# Patient Record
Sex: Female | Born: 1990 | Race: White | Hispanic: No | Marital: Married | Smoking: Former smoker
Health system: Southern US, Community
[De-identification: ages and names within clinical notes are randomized; demographics above are authoritative.]

## PROBLEM LIST (undated history)

## (undated) DIAGNOSIS — Z8719 Personal history of other diseases of the digestive system: Secondary | ICD-10-CM

## (undated) DIAGNOSIS — Z915 Personal history of self-harm: Secondary | ICD-10-CM

## (undated) DIAGNOSIS — Z91410 Personal history of adult physical and sexual abuse: Secondary | ICD-10-CM

## (undated) DIAGNOSIS — Z62810 Personal history of physical and sexual abuse in childhood: Secondary | ICD-10-CM

## (undated) DIAGNOSIS — G43909 Migraine, unspecified, not intractable, without status migrainosus: Secondary | ICD-10-CM

## (undated) DIAGNOSIS — IMO0002 Reserved for concepts with insufficient information to code with codable children: Secondary | ICD-10-CM

## (undated) DIAGNOSIS — Z87440 Personal history of urinary (tract) infections: Secondary | ICD-10-CM

## (undated) DIAGNOSIS — F319 Bipolar disorder, unspecified: Secondary | ICD-10-CM

## (undated) HISTORY — DX: Personal history of urinary (tract) infections: Z87.440

## (undated) HISTORY — DX: Personal history of physical and sexual abuse in childhood: Z62.810

## (undated) HISTORY — DX: Personal history of self-harm: Z91.5

## (undated) HISTORY — PX: TONSILLECTOMY: SUR1361

## (undated) HISTORY — DX: Personal history of other diseases of the digestive system: Z87.19

## (undated) HISTORY — PX: TYMPANOSTOMY TUBE PLACEMENT: SHX32

## (undated) HISTORY — DX: Morbid (severe) obesity due to excess calories: E66.01

## (undated) HISTORY — DX: Reserved for concepts with insufficient information to code with codable children: IMO0002

## (undated) HISTORY — DX: Migraine, unspecified, not intractable, without status migrainosus: G43.909

## (undated) HISTORY — DX: Personal history of adult physical and sexual abuse: Z91.410

---
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
## 2011-01-12 ENCOUNTER — Inpatient Hospital Stay: Payer: Self-pay | Admitting: Psychiatry

## 2011-01-26 ENCOUNTER — Other Ambulatory Visit: Payer: Self-pay | Admitting: Psychiatry

## 2011-04-05 LAB — GAMMA GT
GGT: 22
GGT: 23

## 2011-04-05 LAB — DRUGS OF ABUSE SCREEN W/O ALC, ROUTINE URINE
Amphetamine Screen, Ur: NEGATIVE
Barbiturate Quant, Ur: NEGATIVE
Cocaine Metabolites: NEGATIVE
Methadone: NEGATIVE
Phencyclidine (PCP): NEGATIVE

## 2011-04-05 LAB — URINALYSIS, ROUTINE W REFLEX MICROSCOPIC
Glucose, UA: NEGATIVE
Ketones, ur: NEGATIVE
Nitrite: NEGATIVE
Specific Gravity, Urine: 1.027
pH: 7.5

## 2011-04-05 LAB — DIFFERENTIAL
Basophils Absolute: 0.1
Basophils Relative: 1
Eosinophils Absolute: 0.1 — ABNORMAL LOW
Monocytes Relative: 7
Neutro Abs: 5.8
Neutrophils Relative %: 56

## 2011-04-05 LAB — BASIC METABOLIC PANEL
BUN: 9
CO2: 26
Calcium: 9.4
Chloride: 109
Creatinine, Ser: 0.64
Glucose, Bld: 100 — ABNORMAL HIGH

## 2011-04-05 LAB — CBC
MCHC: 34.3
MCV: 78
Platelets: 445 — ABNORMAL HIGH
RDW: 15.3

## 2011-04-05 LAB — GLUCOSE, RANDOM: Glucose, Bld: 89

## 2011-04-05 LAB — HEPATIC FUNCTION PANEL
Albumin: 3.3 — ABNORMAL LOW
Alkaline Phosphatase: 94
Alkaline Phosphatase: 95
Bilirubin, Direct: 0.2
Indirect Bilirubin: 0.8
Total Bilirubin: 0.8
Total Protein: 6.5

## 2011-04-05 LAB — PREGNANCY, URINE: Preg Test, Ur: NEGATIVE

## 2011-04-05 LAB — T4, FREE: Free T4: 1.26

## 2011-04-05 LAB — URINE MICROSCOPIC-ADD ON

## 2011-05-11 ENCOUNTER — Encounter: Payer: Self-pay | Admitting: Emergency Medicine

## 2011-05-11 ENCOUNTER — Emergency Department (HOSPITAL_COMMUNITY)
Admission: EM | Admit: 2011-05-11 | Discharge: 2011-05-12 | Disposition: A | Discharge status: Home / Self Care | Payer: Medicaid Other | Attending: Emergency Medicine | Admitting: Emergency Medicine

## 2011-05-11 DIAGNOSIS — R110 Nausea: Secondary | ICD-10-CM | POA: Insufficient documentation

## 2011-05-11 DIAGNOSIS — F17200 Nicotine dependence, unspecified, uncomplicated: Secondary | ICD-10-CM | POA: Insufficient documentation

## 2011-05-11 DIAGNOSIS — R1011 Right upper quadrant pain: Secondary | ICD-10-CM | POA: Insufficient documentation

## 2011-05-11 DIAGNOSIS — K219 Gastro-esophageal reflux disease without esophagitis: Secondary | ICD-10-CM | POA: Insufficient documentation

## 2011-05-11 DIAGNOSIS — J45909 Unspecified asthma, uncomplicated: Secondary | ICD-10-CM | POA: Insufficient documentation

## 2011-05-11 NOTE — ED Notes (Signed)
Patient with abdominal pain, has been going on for a couple of months.  Patient states she vomits after she eats.

## 2011-05-12 ENCOUNTER — Encounter (HOSPITAL_COMMUNITY): Payer: Self-pay | Admitting: Emergency Medicine

## 2011-05-12 ENCOUNTER — Emergency Department (HOSPITAL_COMMUNITY): Payer: Medicaid Other

## 2011-05-12 LAB — DIFFERENTIAL
Basophils Relative: 0 % (ref 0–1)
Eosinophils Absolute: 0.1 10*3/uL (ref 0.0–0.7)
Lymphs Abs: 4.6 10*3/uL — ABNORMAL HIGH (ref 0.7–4.0)
Neutrophils Relative %: 66 % (ref 43–77)

## 2011-05-12 LAB — CBC
MCH: 27 pg (ref 26.0–34.0)
MCHC: 32.4 g/dL (ref 30.0–36.0)
Platelets: 457 10*3/uL — ABNORMAL HIGH (ref 150–400)
RBC: 4.85 MIL/uL (ref 3.87–5.11)

## 2011-05-12 LAB — GC/CHLAMYDIA PROBE AMP, GENITAL
Chlamydia, DNA Probe: NEGATIVE
GC Probe Amp, Genital: NEGATIVE

## 2011-05-12 LAB — COMPREHENSIVE METABOLIC PANEL
ALT: 18 U/L (ref 0–35)
AST: 15 U/L (ref 0–37)
Albumin: 3.6 g/dL (ref 3.5–5.2)
Alkaline Phosphatase: 100 U/L (ref 39–117)
Potassium: 3.9 mEq/L (ref 3.5–5.1)
Sodium: 139 mEq/L (ref 135–145)
Total Protein: 7.2 g/dL (ref 6.0–8.3)

## 2011-05-12 LAB — URINALYSIS, ROUTINE W REFLEX MICROSCOPIC
Bilirubin Urine: NEGATIVE
Glucose, UA: NEGATIVE mg/dL
Hgb urine dipstick: NEGATIVE
Nitrite: NEGATIVE
Specific Gravity, Urine: 1.029 (ref 1.005–1.030)
pH: 6.5 (ref 5.0–8.0)

## 2011-05-12 LAB — WET PREP, GENITAL
Trich, Wet Prep: NONE SEEN
Yeast Wet Prep HPF POC: NONE SEEN

## 2011-05-12 MED ORDER — ONDANSETRON HCL 4 MG PO TABS: 4.0000 mg | ORAL_TABLET | Freq: Four times a day (QID) | ORAL | Status: AC

## 2011-05-12 MED ORDER — PROMETHAZINE HCL 25 MG RE SUPP: 25.0000 mg | Freq: Four times a day (QID) | RECTAL | Status: DC | PRN

## 2011-05-12 MED ORDER — PROMETHAZINE HCL 25 MG PO TABS: 25.0000 mg | ORAL_TABLET | Freq: Four times a day (QID) | ORAL | Status: DC | PRN

## 2011-05-12 MED ORDER — OXYCODONE-ACETAMINOPHEN 5-325 MG PO TABS
2.0000 | ORAL_TABLET | Freq: Once | ORAL | Status: AC
  Administered 2011-05-12: 2 via ORAL
  Filled 2011-05-12: qty 2

## 2011-05-12 MED ORDER — OXYCODONE-ACETAMINOPHEN 5-325 MG PO TABS: 2.0000 | ORAL_TABLET | ORAL | Status: AC | PRN

## 2011-05-12 NOTE — ED Notes (Signed)
Patient with abdominal pain, no nausea or vomiting, no diarrhea.  Patient points to lower left and right quadrants for pain.

## 2011-05-12 NOTE — ED Provider Notes (Signed)
History     CSN: [id number] Arrival date & time: 05/11/2011  8:33 PM   First MD Initiated Contact with Patient 05/12/11 0010      Chief Complaint  Patient presents with  . Abdominal Pain    (Consider location/radiation/quality/duration/timing/severity/associated sxs/prior treatment) HPI 20 year old female presents to the emergency department with complaint of abdominal pain over the last 2-4 months. Patient reports onset of left lower quadrant abdominal pain about 4 months ago. She reports she was seen at [facility] regional at that time and had labs and ultrasound done and was told to followup with either GYN or primary care Dr. Patient has not done so. Patient reports left lower quadrant pain is intermittent worse around her period and is crampy in nature. Patient reports 2 months of right upper quadrant pain after eating. Patient reports after eating she has sharp pain in her right upper quadrant and then immediately has to have a bowel movement. She denies any vomiting. She does report she has nausea after eating. Patient with history of GERD and takes Prilosec for this. Patient is a smoker. She denies taking any oral contraceptives. Last measured. 15 October. No prior history of abdominal surgeries. Patient presents today due to urging from her mother. She denies any worsening of her symptoms. She denies any weight loss  Past Medical History  Diagnosis Date  . Asthma     Past Surgical History  Procedure Date  . Tonsillectomy   . Tympanostomy tube placement     No family history on file.  History  Substance Use Topics  . Smoking status: Current Everyday Smoker    Types: Cigarettes  . Smokeless tobacco: Not on file  . Alcohol Use: Yes     occasionally    OB History    Grav Para Term Preterm Abortions TAB SAB Ect Mult Living                  Review of Systems  Constitutional: Negative.   HENT: Negative.   Eyes: Negative.   Respiratory: Negative.   Cardiovascular:  Negative.   Gastrointestinal: Positive for nausea and abdominal pain.  Genitourinary: Negative.   Musculoskeletal: Negative.   Skin: Negative.   Neurological: Negative.   Hematological: Negative.   Psychiatric/Behavioral: Negative.   All other systems reviewed and are negative.    Allergies  Other  Home Medications  No current outpatient prescriptions on file.  BP 131/93  Pulse 95  Temp(Src) 98.1 F (36.7 C) (Oral)  Resp 18  SpO2 98%  LMP 04/12/2011  Physical Exam  Constitutional: She appears well-developed and well-nourished.       Patient is morbidly obese  HENT:  Head: Normocephalic and atraumatic.  Right Ear: External ear normal.  Left Ear: External ear normal.  Nose: Nose normal.  Mouth/Throat: Oropharynx is clear and moist.  Eyes: Conjunctivae and EOM are normal. Pupils are equal, round, and reactive to light.  Neck: Normal range of motion. Neck supple. No JVD present. No tracheal deviation present. No thyromegaly present.  Cardiovascular: Normal rate, regular rhythm, normal heart sounds and intact distal pulses.  Exam reveals no gallop and no friction rub.   No murmur heard. Pulmonary/Chest: Effort normal and breath sounds normal. No stridor. No respiratory distress. She has no wheezes. She has no rales. She exhibits no tenderness.  Abdominal: Soft. Bowel sounds are normal. She exhibits no distension and no mass. There is tenderness. There is no rebound and no guarding.       Pain  in right upper quadrant with palpation. No pain with palpation of left lower quadrant  Genitourinary: Vagina normal and uterus normal. No vaginal discharge found.       Unable to reproduce left lower quadrant pain with palpation of left adnexa area. Exam limited due to body habitus  Musculoskeletal: Normal range of motion. She exhibits no edema and no tenderness.  Lymphadenopathy:    She has no cervical adenopathy.  Skin: Skin is dry. No rash noted. No erythema. No pallor.    Psychiatric: She has a normal mood and affect. Her behavior is normal. Judgment and thought content normal.    ED Course  Procedures (including critical care time)  Labs Reviewed  CBC - Abnormal; Notable for the following:    WBC 16.8 (*)    Platelets 457 (*)    All other components within normal limits  DIFFERENTIAL - Abnormal; Notable for the following:    Neutro Abs 11.0 (*)    Lymphs Abs 4.6 (*)    All other components within normal limits  WET PREP, GENITAL - Abnormal; Notable for the following:    Clue Cells, Wet Prep FEW (*)    WBC, Wet Prep HPF POC FEW (*)    All other components within normal limits  COMPREHENSIVE METABOLIC PANEL  LIPASE, BLOOD  URINALYSIS, ROUTINE W REFLEX MICROSCOPIC  PREGNANCY, URINE  GC/CHLAMYDIA PROBE AMP, GENITAL      MDM  20 year old female with ongoing right upper quadrant and left lower "her abdominal pain for several months. Patient with slight leukocytosis here today. Body habitus suggests polycystic ovarian disease. Will check ultrasound for possible cholelithiasis as cause of pain. Will refer patient to gastroenterology and/or primary care Dr. if ultrasound negative for further workup        Olivia Mackie, MD 05/12/11 [phone]

## 2011-05-15 ENCOUNTER — Emergency Department: Payer: Self-pay | Admitting: Emergency Medicine

## 2011-06-09 ENCOUNTER — Inpatient Hospital Stay: Payer: Self-pay | Admitting: Psychiatry

## 2011-06-23 ENCOUNTER — Emergency Department: Payer: Self-pay | Admitting: Emergency Medicine

## 2011-08-01 ENCOUNTER — Emergency Department: Payer: Self-pay | Admitting: Emergency Medicine

## 2011-08-01 LAB — URINALYSIS, COMPLETE
Bacteria: NONE SEEN
Bilirubin,UR: NEGATIVE
Blood: NEGATIVE
Glucose,UR: NEGATIVE mg/dL (ref 0–75)
Hyaline Cast: 1
Ketone: NEGATIVE
Specific Gravity: 1.019 (ref 1.003–1.030)
Squamous Epithelial: 6
WBC UR: 1 /HPF (ref 0–5)

## 2011-08-01 LAB — PREGNANCY, URINE: Pregnancy Test, Urine: NEGATIVE m[IU]/mL

## 2011-08-22 ENCOUNTER — Emergency Department: Payer: Self-pay | Admitting: *Deleted

## 2011-08-22 LAB — COMPREHENSIVE METABOLIC PANEL
Alkaline Phosphatase: 84 U/L (ref 82–169)
BUN: 9 mg/dL (ref 7–18)
Chloride: 107 mmol/L (ref 98–107)
EGFR (African American): >60
EGFR (Non-African Amer.): >60
Glucose: 92 mg/dL (ref 65–99)
SGOT(AST): 17 U/L (ref 0–26)
SGPT (ALT): 31 U/L
Total Protein: 7.1 g/dL (ref 6.4–8.6)

## 2011-08-22 LAB — URINALYSIS, COMPLETE
Bilirubin,UR: NEGATIVE
Glucose,UR: NEGATIVE mg/dL (ref 0–75)
Ketone: NEGATIVE
RBC,UR: 2 /HPF (ref 0–5)
Specific Gravity: 1.023 (ref 1.003–1.030)
Squamous Epithelial: 10

## 2011-08-22 LAB — CBC
HCT: 39.7 % (ref 35.0–47.0)
MCHC: 33.9 g/dL (ref 32.0–36.0)
Platelet: 423 10*3/uL (ref 150–440)
RDW: 15.3 % — ABNORMAL HIGH (ref 11.5–14.5)
WBC: 10.9 10*3/uL (ref 3.6–11.0)

## 2011-08-22 LAB — PREGNANCY, URINE: Pregnancy Test, Urine: NEGATIVE m[IU]/mL

## 2011-08-23 ENCOUNTER — Emergency Department (HOSPITAL_COMMUNITY)
Admission: EM | Admit: 2011-08-23 | Discharge: 2011-08-23 | Disposition: A | Discharge status: Home Health | Payer: Medicaid Other | Attending: Emergency Medicine | Admitting: Emergency Medicine

## 2011-08-23 ENCOUNTER — Encounter (HOSPITAL_COMMUNITY): Payer: Self-pay | Admitting: *Deleted

## 2011-08-23 ENCOUNTER — Emergency Department (HOSPITAL_COMMUNITY): Payer: Medicaid Other

## 2011-08-23 DIAGNOSIS — J45909 Unspecified asthma, uncomplicated: Secondary | ICD-10-CM | POA: Insufficient documentation

## 2011-08-23 DIAGNOSIS — R1110 Vomiting, unspecified: Secondary | ICD-10-CM | POA: Insufficient documentation

## 2011-08-23 DIAGNOSIS — M545 Low back pain, unspecified: Secondary | ICD-10-CM | POA: Insufficient documentation

## 2011-08-23 DIAGNOSIS — R1031 Right lower quadrant pain: Secondary | ICD-10-CM | POA: Insufficient documentation

## 2011-08-23 DIAGNOSIS — F319 Bipolar disorder, unspecified: Secondary | ICD-10-CM | POA: Insufficient documentation

## 2011-08-23 DIAGNOSIS — R197 Diarrhea, unspecified: Secondary | ICD-10-CM | POA: Insufficient documentation

## 2011-08-23 DIAGNOSIS — N946 Dysmenorrhea, unspecified: Secondary | ICD-10-CM | POA: Insufficient documentation

## 2011-08-23 HISTORY — DX: Bipolar disorder, unspecified: F31.9

## 2011-08-23 LAB — PREGNANCY, URINE: Preg Test, Ur: NEGATIVE

## 2011-08-23 LAB — COMPREHENSIVE METABOLIC PANEL
BUN: 8 mg/dL (ref 6–23)
Calcium: 9.5 mg/dL (ref 8.4–10.5)
Creatinine, Ser: 0.62 mg/dL (ref 0.50–1.10)
GFR calc Af Amer: >90 mL/min (ref >90)
GFR calc non Af Amer: >90 mL/min (ref >90)
Glucose, Bld: 97 mg/dL (ref 70–99)
Sodium: 139 mEq/L (ref 135–145)
Total Protein: 6.9 g/dL (ref 6.0–8.3)

## 2011-08-23 LAB — CBC
HCT: 37.9 % (ref 36.0–46.0)
MCH: 27.1 pg (ref 26.0–34.0)
MCHC: 33.8 g/dL (ref 30.0–36.0)
MCV: 80.3 fL (ref 78.0–100.0)
RDW: 14.5 % (ref 11.5–15.5)

## 2011-08-23 LAB — URINE MICROSCOPIC-ADD ON

## 2011-08-23 LAB — URINALYSIS, ROUTINE W REFLEX MICROSCOPIC
Ketones, ur: NEGATIVE mg/dL
Protein, ur: 30 mg/dL — AB
Urobilinogen, UA: 0.2 mg/dL (ref 0.0–1.0)

## 2011-08-23 LAB — WET PREP, GENITAL
Trich, Wet Prep: NONE SEEN
Yeast Wet Prep HPF POC: NONE SEEN

## 2011-08-23 MED ORDER — ACETAMINOPHEN 325 MG PO TABS
975.0000 mg | ORAL_TABLET | Freq: Once | ORAL | Status: AC
  Administered 2011-08-23: 975 mg via ORAL
  Filled 2011-08-23: qty 3

## 2011-08-23 NOTE — Discharge Instructions (Signed)
I am sorry you are having pain.  I think this is related to your menstrual cycle.  I have provided you with two options for a Gynecologist here in [place].  Please try to follow up with a Gynecologist about your pain.   Dysmenorrhea Dysmenorrhea is pain during a menstrual period. The pain is caused by the tightening (contracting) of the muscles of the uterus. Headache, feeling sick to your stomach (nausea), throwing up (vomiting), or low back pain may occur with this condition. HOME CARE  Only take medicine as told by your doctor.   Place a heating pad or hot water bottle on your lower back or belly (abdomen). Do not sleep with a heating pad.   Exercise may help lessen the pain.   Massage the lower back or belly.   Stop smoking.   Avoid alcohol or caffeine.   Try yoga or acupuncture.  GET HELP RIGHT AWAY IF:   Your pain does not get better with medicine.   Your pain gets worse while taking pain medicine.   You have a fever.   You keep feeling sick to your stomach or keep throwing up.   Your pain moves to your upper belly.   Your period bleeding is heavier than normal.   You pass out (faint).  MAKE SURE YOU:  Understand these instructions.   Will watch your condition.   Will get help right away if you are not doing well or get worse.  Document Released: 09/10/2008 Document Revised: 02/24/2011 Document Reviewed: 10/20/2009 [facility] Patient Information 2012 [place], [place].

## 2011-08-23 NOTE — ED Notes (Signed)
Pt states that she is having lower abdominal pain more right sided than left, period started on the way to hospital. Pt states that the pain has been going on for a week. Pt states pain radiates to her back.

## 2011-08-23 NOTE — ED Notes (Signed)
Patient refusing heat application to affected area

## 2011-08-23 NOTE — ED Notes (Signed)
Patient presents with right groin pain that has been present for several days with pain now radiating to right flank. Patient denies urinary symptoms of frequency, urgency and dysuria, patient denies vaginal discharge other than her menstrual cycle that began this morning.  Patient alert and oriented x 4, patient reporting pain 7/10

## 2011-08-23 NOTE — ED Provider Notes (Signed)
I saw and evaluated the patient, reviewed the resident's note and I agree with the findings and plan.  Patient personally seen by me as well as the resident, recurrent and similar pain to what she was seen for in November workup here today negative for pregnancy negative pelvic ultrasound. No evidence of ovarian cyst exam not consistent with PID. No acute findings. Pelvic cultures pending but no discharge. Will refer to GYN locally the patient lives in the [place] area she may want to seek GYN followup there.  Shelda Jakes, MD 08/23/11 [phone]

## 2011-08-23 NOTE — ED Provider Notes (Signed)
History     CSN: [id number]  Arrival date & time 08/23/11  4098   First MD Initiated Contact with Patient 08/23/11 [phone]      Chief Complaint  Patient presents with  . Abdominal Pain    HPI Pt presents with right lower quadrant pain that has been present for one week.  She says the pain is constant and has been getting worse.  She says she has not had a fever or chills, and says she vomited once about three days ago. She says she "always has diarrhea" and it is no different.  No blood in stool.     She says her period started today.  She went to [facility] yesterday, and says they did "a piss test and a blood test and sent me on my way."   The prescribed her tramadol.   She is also having low back pain.  This started about 3 days ago.  She says it hurts to walk or move. She has not had any new injury.  She denies weakness, numbness or tingling in her feet.  Denies incontinence.  No dysuria, no blood in urine.   Past Medical History  Diagnosis Date  . Asthma   . Bipolar disorder     Pt is supposed to be taking medications and is not.     Past Surgical History  Procedure Date  . Tonsillectomy   . Tympanostomy tube placement     History reviewed. No pertinent family history.  History  Substance Use Topics  . Smoking status: Current Everyday Smoker    Types: Cigarettes  . Smokeless tobacco: Not on file  . Alcohol Use: Yes     occasionally    Review of Systems  Constitutional: Negative for fever and chills.  HENT: Negative for rhinorrhea.   Eyes: Negative for visual disturbance.  Respiratory: Negative for shortness of breath.   Cardiovascular: Negative for chest pain.  Gastrointestinal: Positive for abdominal pain.  Genitourinary: Negative for dysuria and vaginal discharge.  Musculoskeletal: Positive for back pain.  Skin: Negative for rash.    Allergies  Ibuprofen and Other  Home Medications   Current Outpatient Rx  Name Route Sig Dispense Refill  .  TRAMADOL HCL 50 MG PO TABS Oral Take 50 mg by mouth every 6 (six) hours as needed. For pain      BP 140/72  Pulse 78  Temp(Src) 97.9 F (36.6 C) (Oral)  Resp 20  SpO2 99%  Physical Exam  Constitutional:       Morbidly obese female in no distress.  HENT:  Head: Normocephalic and atraumatic.  Mouth/Throat: Oropharynx is clear and moist.  Eyes: EOM are normal. Pupils are equal, round, and reactive to light. No scleral icterus.  Neck: Normal range of motion. Neck supple.  Cardiovascular: Normal rate, regular rhythm and normal heart sounds.   Pulmonary/Chest: Effort normal and breath sounds normal. No respiratory distress.  Abdominal: Soft. There is generalized tenderness. There is no rebound and no guarding.       Obese, difficult to palpate organs due to obesity.   Genitourinary: Pelvic exam was performed with patient prone. There is no rash on the right labia. There is no rash on the left labia. Cervix exhibits no discharge. No erythema around the vagina. No vaginal discharge found.       Pt with blood in vaginal vault and cervix.  Exam severely limited by obesity, but uterus feels normal size and no adnexal masses palpated.  Musculoskeletal:       Pt has lumbar back pain with palpation, no CVA tenderness.  She has normal ROM, normal gait.  She is able to stand to undress for Pelvic exam and get into stretcher in lithotomy position without assistance.     ED Course  Procedures (including critical care time)  Labs Reviewed  URINALYSIS, ROUTINE W REFLEX MICROSCOPIC - Abnormal; Notable for the following:    Color, Urine AMBER (*) BIOCHEMICALS MAY BE AFFECTED BY COLOR   APPearance CLOUDY (*)    Hgb urine dipstick LARGE (*)    Protein, ur 30 (*)    All other components within normal limits  CBC - Abnormal; Notable for the following:    WBC 16.7 (*)    Platelets 427 (*)    All other components within normal limits  COMPREHENSIVE METABOLIC PANEL - Abnormal; Notable for the  following:    Albumin 3.4 (*)    All other components within normal limits  WET PREP, GENITAL - Abnormal; Notable for the following:    Clue Cells Wet Prep HPF POC FEW (*)    WBC, Wet Prep HPF POC FEW (*)    All other components within normal limits  URINE MICROSCOPIC-ADD ON - Abnormal; Notable for the following:    Squamous Epithelial / LPF MANY (*)    All other components within normal limits  PREGNANCY, URINE  LIPASE, BLOOD  GC/CHLAMYDIA PROBE AMP, GENITAL   US Transvaginal Non-ob  08/23/2011  *RADIOLOGY REPORT*  Clinical Data: Right pelvic pain  TRANSABDOMINAL AND TRANSVAGINAL ULTRASOUND OF PELVIS Technique:  Both transabdominal and transvaginal ultrasound examinations of the pelvis were performed. Transabdominal technique was performed for global imaging of the pelvis including uterus, ovaries, adnexal regions, and pelvic cul-de-sac.  Comparison: None.   It was necessary to proceed with endovaginal exam following the transabdominal exam to visualize the bilateral ovaries.  Findings:  Uterus: Normal in size and appearance, measuring 7.0 x 3.5 x 4.5 cm.  Endometrium: Normal in thickness and appearance, measuring 5 mm.  Right ovary:  Normal appearance/no adnexal mass, measuring 3.9 x 2.0 x 2.9 cm.  Left ovary: Normal appearance/no adnexal mass, measuring 4.4 x 2.4 x 2.7 cm.  Other findings: No free fluid.  IMPRESSION: Normal study.  No evidence of pelvic mass or other significant abnormality.  Original Report Authenticated By: Charline Bills, M.D.   US Pelvis Complete  08/23/2011  *RADIOLOGY REPORT*  Clinical Data: Right pelvic pain  TRANSABDOMINAL AND TRANSVAGINAL ULTRASOUND OF PELVIS Technique:  Both transabdominal and transvaginal ultrasound examinations of the pelvis were performed. Transabdominal technique was performed for global imaging of the pelvis including uterus, ovaries, adnexal regions, and pelvic cul-de-sac.  Comparison: None.   It was necessary to proceed with endovaginal exam  following the transabdominal exam to visualize the bilateral ovaries.  Findings:  Uterus: Normal in size and appearance, measuring 7.0 x 3.5 x 4.5 cm.  Endometrium: Normal in thickness and appearance, measuring 5 mm.  Right ovary:  Normal appearance/no adnexal mass, measuring 3.9 x 2.0 x 2.9 cm.  Left ovary: Normal appearance/no adnexal mass, measuring 4.4 x 2.4 x 2.7 cm.  Other findings: No free fluid.  IMPRESSION: Normal study.  No evidence of pelvic mass or other significant abnormality.  Original Report Authenticated By: Charline Bills, M.D.     1. Menstrual pain       MDM  Pt is allergic to ibuprofen and OCP's.  Advised using tylenol as needed for pain.  Pt given contact  information and instructed to follow up with GYN.         Ardyth Gal, MD 08/23/11 [phone]

## 2011-08-24 LAB — GC/CHLAMYDIA PROBE AMP, GENITAL
Chlamydia, DNA Probe: NEGATIVE
GC Probe Amp, Genital: NEGATIVE

## 2011-09-20 ENCOUNTER — Inpatient Hospital Stay: Payer: Self-pay | Admitting: Unknown Physician Specialty

## 2011-09-20 LAB — DRUG SCREEN, URINE
Barbiturates, Ur Screen: NEGATIVE (ref ?–200)
Benzodiazepine, Ur Scrn: NEGATIVE (ref ?–200)
Cannabinoid 50 Ng, Ur ~~LOC~~: POSITIVE (ref ?–50)
Opiate, Ur Screen: NEGATIVE (ref ?–300)

## 2011-09-20 LAB — SALICYLATE LEVEL: Salicylates, Serum: 2.9 mg/dL — ABNORMAL HIGH

## 2011-09-20 LAB — COMPREHENSIVE METABOLIC PANEL
Alkaline Phosphatase: 96 U/L (ref 82–169)
Bilirubin,Total: 0.4 mg/dL (ref 0.2–1.0)
Calcium, Total: 8.9 mg/dL — ABNORMAL LOW (ref 9.0–10.7)
Creatinine: 0.62 mg/dL (ref 0.60–1.30)
EGFR (African American): >60
EGFR (Non-African Amer.): >60
Glucose: 88 mg/dL (ref 65–99)
SGPT (ALT): 22 U/L
Sodium: 141 mmol/L (ref 136–145)
Total Protein: 6.8 g/dL (ref 6.4–8.6)

## 2011-09-20 LAB — CBC
HCT: 40.9 % (ref 35.0–47.0)
HGB: 13.6 g/dL (ref 12.0–16.0)
MCH: 26.9 pg (ref 26.0–34.0)
RBC: 5.05 10*6/uL (ref 3.80–5.20)

## 2011-09-20 LAB — TSH: Thyroid Stimulating Horm: 0.975 u[IU]/mL

## 2011-09-20 LAB — ACETAMINOPHEN LEVEL: Acetaminophen: <2 ug/mL

## 2011-09-20 LAB — ETHANOL: Ethanol %: <0.003 % (ref 0.000–0.080)

## 2011-09-21 LAB — URINALYSIS, COMPLETE
Ketone: NEGATIVE
Leukocyte Esterase: NEGATIVE
Protein: NEGATIVE
Squamous Epithelial: 4

## 2011-09-21 LAB — BEHAVIORAL MEDICINE 1 PANEL
Alkaline Phosphatase: 82 U/L (ref 82–169)
Anion Gap: 8 (ref 7–16)
Basophil #: 0.1 10*3/uL (ref 0.0–0.1)
Basophil %: 0.4 %
Bilirubin,Total: 0.5 mg/dL (ref 0.2–1.0)
Chloride: 107 mmol/L (ref 98–107)
Co2: 26 mmol/L (ref 21–32)
EGFR (African American): >60
Eosinophil #: 0.2 10*3/uL (ref 0.0–0.7)
Eosinophil %: 1.6 %
Glucose: 85 mg/dL (ref 65–99)
Lymphocyte #: 4.3 10*3/uL — ABNORMAL HIGH (ref 1.0–3.6)
Lymphocyte %: 31.5 %
MCHC: 32.4 g/dL (ref 32.0–36.0)
Monocyte #: 0.8 10*3/uL — ABNORMAL HIGH (ref 0.0–0.7)
Neutrophil #: 8.3 10*3/uL — ABNORMAL HIGH (ref 1.4–6.5)
Osmolality: 279 (ref 275–301)
Platelet: 379 10*3/uL (ref 150–440)
RBC: 4.78 10*6/uL (ref 3.80–5.20)
SGPT (ALT): 22 U/L
WBC: 13.7 10*3/uL — ABNORMAL HIGH (ref 3.6–11.0)

## 2011-10-03 ENCOUNTER — Emergency Department: Payer: Self-pay | Admitting: Emergency Medicine

## 2011-10-05 ENCOUNTER — Emergency Department: Payer: Self-pay | Admitting: Emergency Medicine

## 2011-10-14 LAB — COMPREHENSIVE METABOLIC PANEL
Alkaline Phosphatase: 62 U/L — ABNORMAL LOW (ref 82–169)
Calcium, Total: 8.2 mg/dL — ABNORMAL LOW (ref 9.0–10.7)
Chloride: 106 mmol/L (ref 98–107)
Co2: 26 mmol/L (ref 21–32)
EGFR (African American): >60
Osmolality: 281 (ref 275–301)
Potassium: 3.7 mmol/L (ref 3.5–5.1)
SGOT(AST): 14 U/L (ref 0–26)
SGPT (ALT): 23 U/L
Total Protein: 6.1 g/dL — ABNORMAL LOW (ref 6.4–8.6)

## 2011-10-14 LAB — CBC WITH DIFFERENTIAL/PLATELET
Basophil #: 0.1 10*3/uL (ref 0.0–0.1)
Basophil %: 0.5 %
Eosinophil #: 0.1 10*3/uL (ref 0.0–0.7)
HCT: 37.4 % (ref 35.0–47.0)
Lymphocyte %: 34.1 %
MCH: 26.1 pg (ref 26.0–34.0)
MCV: 80 fL (ref 80–100)
Monocyte #: 0.9 x10 3/mm (ref 0.2–0.9)
Neutrophil #: 9.2 10*3/uL — ABNORMAL HIGH (ref 1.4–6.5)
WBC: 15.6 10*3/uL — ABNORMAL HIGH (ref 3.6–11.0)

## 2011-10-14 LAB — SALICYLATE LEVEL: Salicylates, Serum: 2.7 mg/dL

## 2011-10-14 LAB — ACETAMINOPHEN LEVEL: Acetaminophen: <2 ug/mL

## 2011-10-14 LAB — CK: CK, Total: 22 U/L (ref 21–215)

## 2011-10-15 ENCOUNTER — Inpatient Hospital Stay: Payer: Self-pay | Admitting: Psychiatry

## 2011-10-15 LAB — URINALYSIS, COMPLETE
Bilirubin,UR: NEGATIVE
Blood: NEGATIVE
Glucose,UR: NEGATIVE mg/dL (ref 0–75)
Leukocyte Esterase: NEGATIVE
Ph: 5 (ref 4.5–8.0)
RBC,UR: 1 /HPF (ref 0–5)
WBC UR: 1 /HPF (ref 0–5)

## 2011-10-15 LAB — DRUG SCREEN, URINE
Benzodiazepine, Ur Scrn: POSITIVE (ref ?–200)
Cannabinoid 50 Ng, Ur ~~LOC~~: POSITIVE (ref ?–50)
MDMA (Ecstasy)Ur Screen: POSITIVE (ref ?–500)
Methadone, Ur Screen: NEGATIVE (ref ?–300)
Opiate, Ur Screen: NEGATIVE (ref ?–300)
Phencyclidine (PCP) Ur S: NEGATIVE (ref ?–25)
Tricyclic, Ur Screen: NEGATIVE (ref ?–1000)

## 2011-10-15 LAB — VALPROIC ACID LEVEL: Valproic Acid: 78 ug/mL

## 2011-10-15 LAB — ACETAMINOPHEN LEVEL: Acetaminophen: 4 ug/mL — ABNORMAL LOW

## 2011-10-16 LAB — CBC WITH DIFFERENTIAL/PLATELET
Basophil #: 0.1 10*3/uL (ref 0.0–0.1)
Eosinophil #: 0.1 10*3/uL (ref 0.0–0.7)
HGB: 12.1 g/dL (ref 12.0–16.0)
MCH: 25.3 pg — ABNORMAL LOW (ref 26.0–34.0)
MCHC: 31.7 g/dL — ABNORMAL LOW (ref 32.0–36.0)
MCV: 80 fL (ref 80–100)
Monocyte %: 3.2 %
Neutrophil #: 13.1 10*3/uL — ABNORMAL HIGH (ref 1.4–6.5)
Neutrophil %: 77.6 %
Platelet: 462 10*3/uL — ABNORMAL HIGH (ref 150–440)
WBC: 16.9 10*3/uL — ABNORMAL HIGH (ref 3.6–11.0)

## 2011-10-18 LAB — BASIC METABOLIC PANEL
Anion Gap: 9 (ref 7–16)
BUN: 10 mg/dL (ref 7–18)
Calcium, Total: 9.4 mg/dL (ref 9.0–10.7)
Chloride: 107 mmol/L (ref 98–107)
Creatinine: 0.71 mg/dL (ref 0.60–1.30)
EGFR (African American): >60
EGFR (Non-African Amer.): >60
Sodium: 141 mmol/L (ref 136–145)

## 2011-10-18 LAB — LITHIUM LEVEL: Lithium: 0.53 mmol/L — ABNORMAL LOW

## 2011-12-08 ENCOUNTER — Emergency Department: Payer: Self-pay | Admitting: *Deleted

## 2012-10-10 IMAGING — US ABDOMEN ULTRASOUND
1 series · 17 of 25 positions shown · non-contrast
Comparison: none

REASON FOR EXAM: RUQ pain
COMMENTS:

[Series 1: abdomen ultrasound · 17 of 74 slices shown]
[im 1/74]
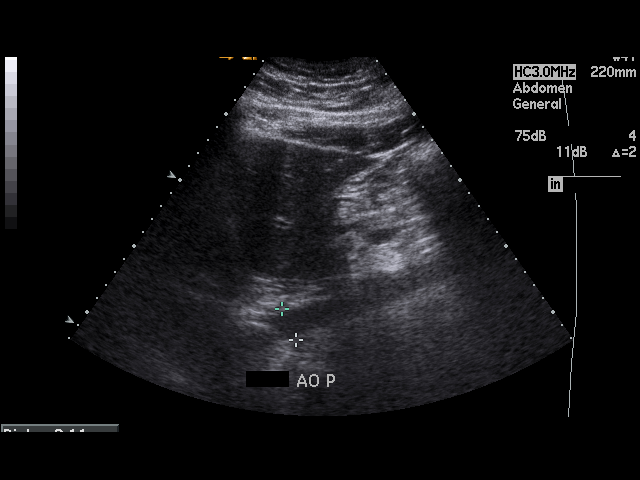
[im 7/74]
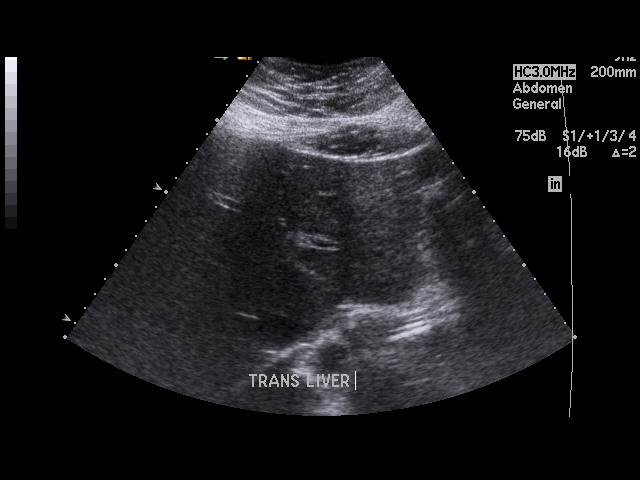
[im 10/74]
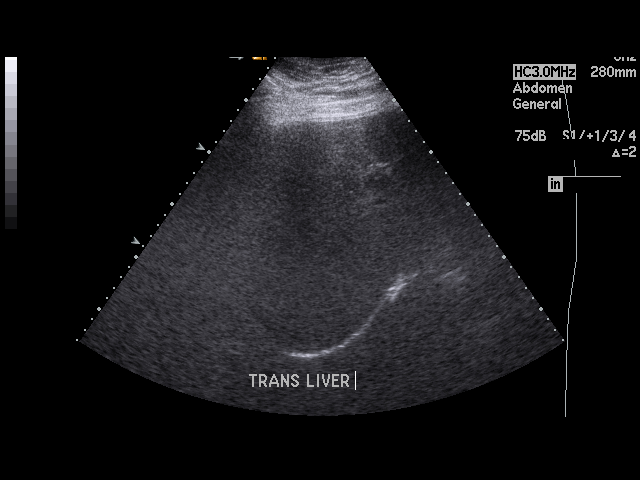
[im 16/74]
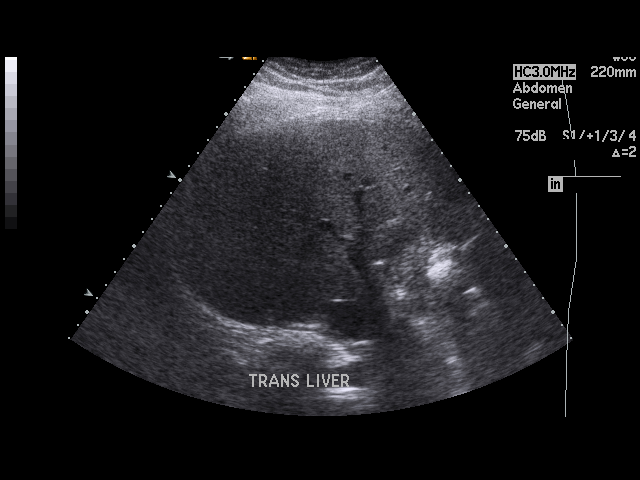
[im 19/74]
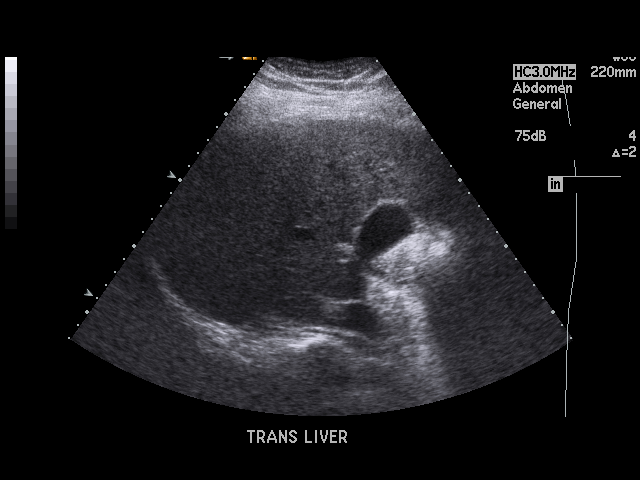
[im 25/74]
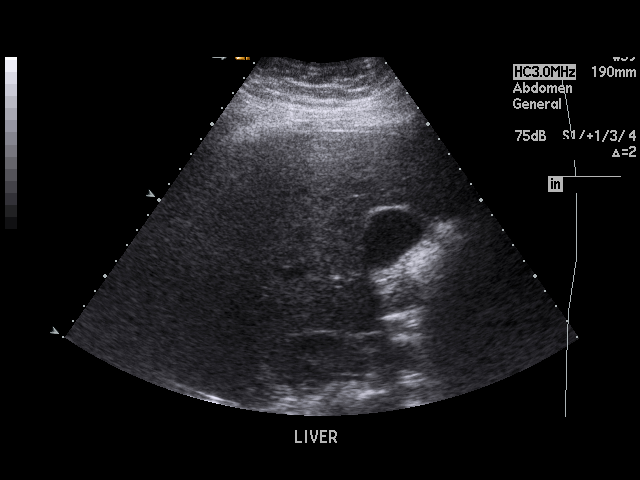
[im 28/74]
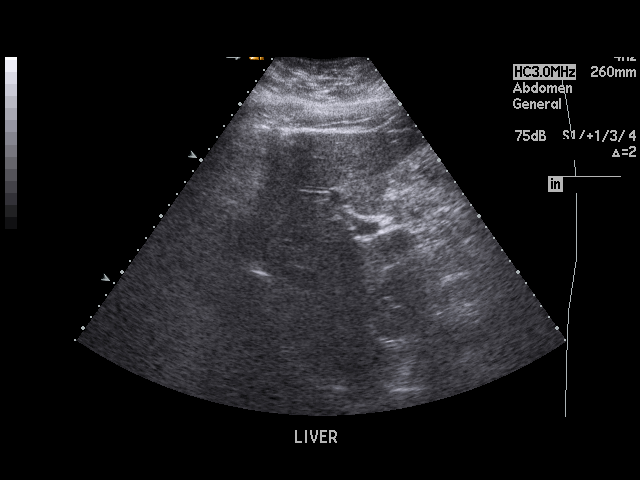
[im 34/74]
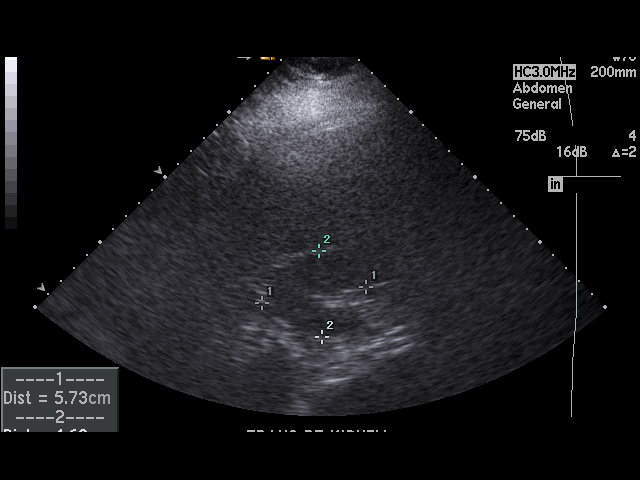
[im 37/74]
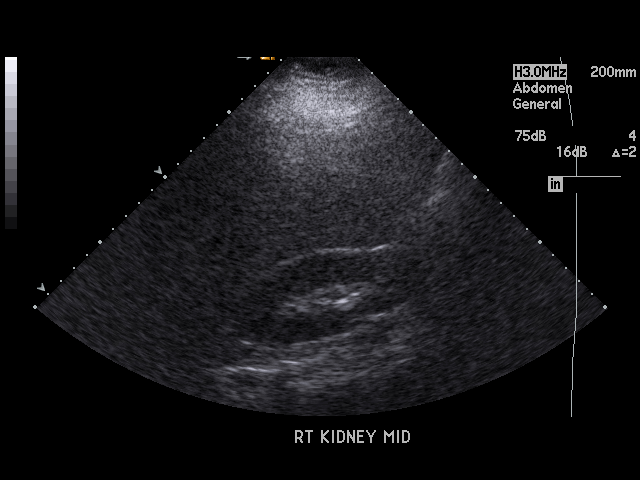
[im 40/74]
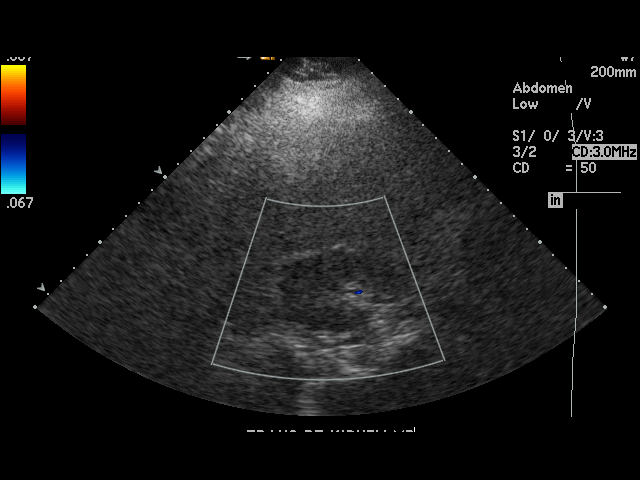
[im 46/74]
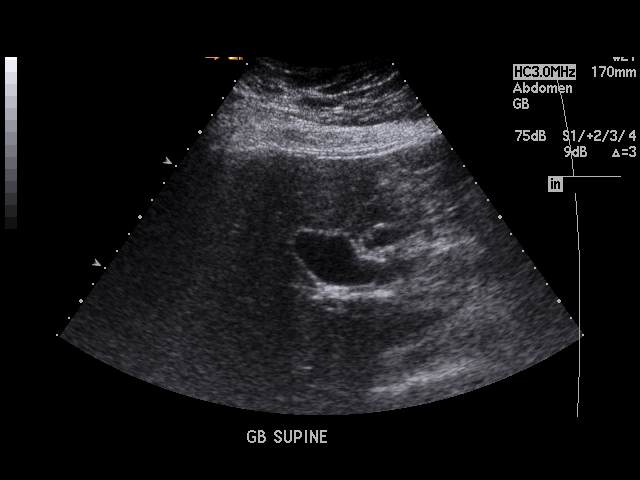
[im 49/74]
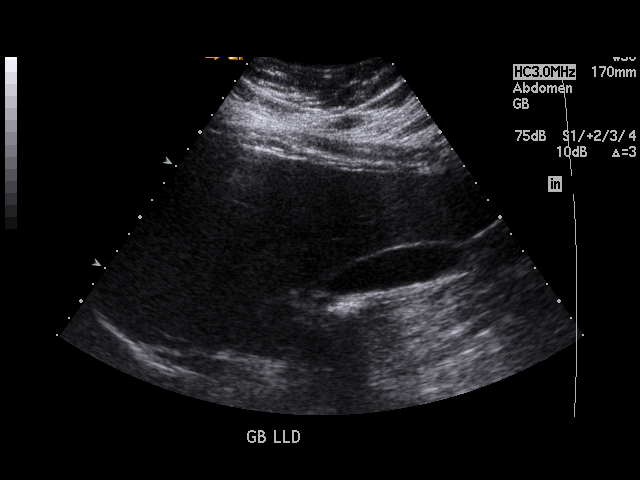
[im 55/74]
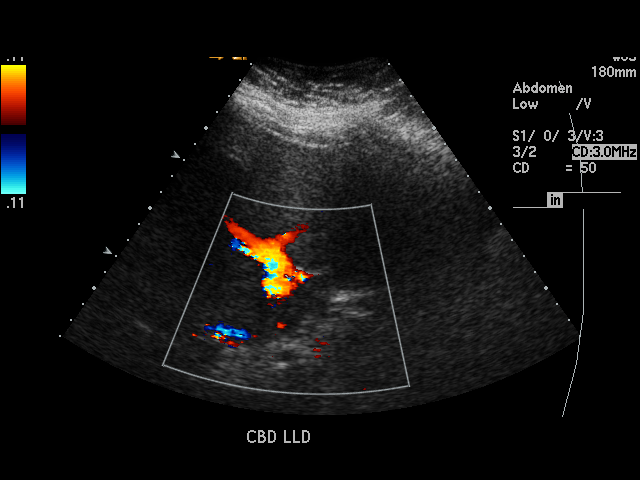
[im 58/74]
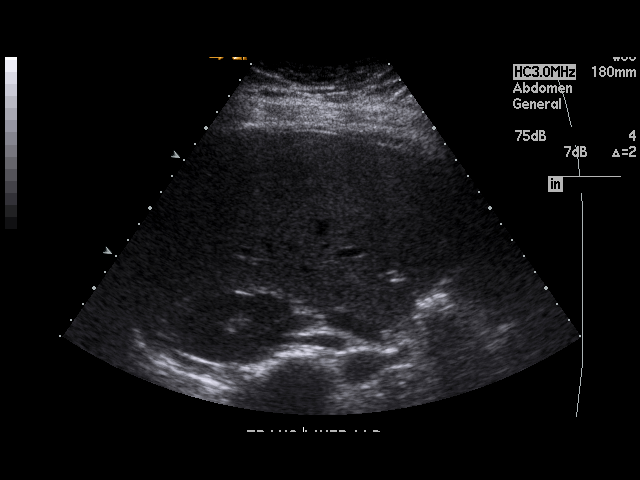
[im 64/74]
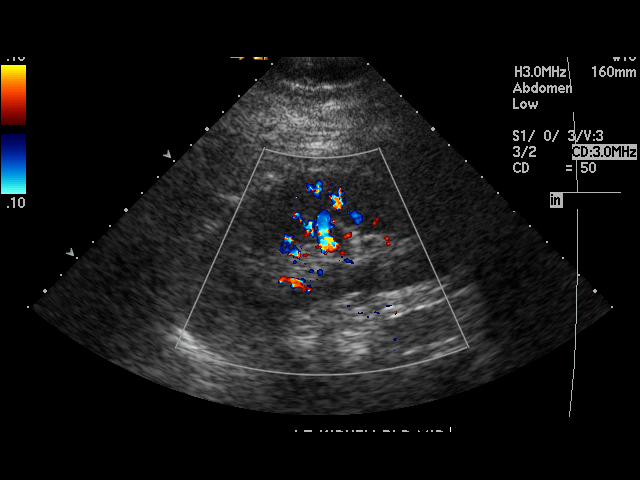
[im 67/74]
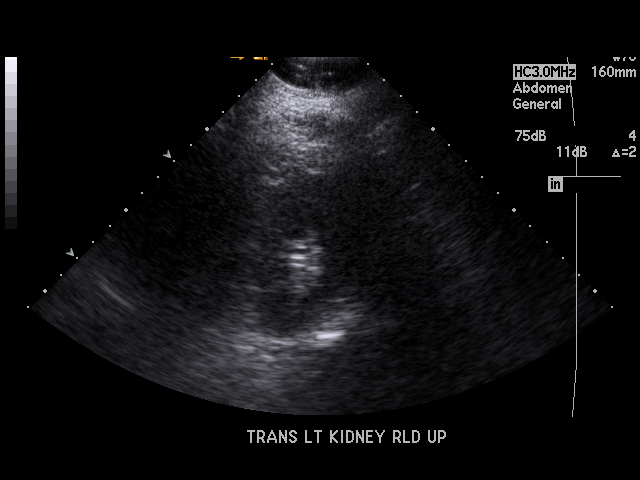
[im 74/74]
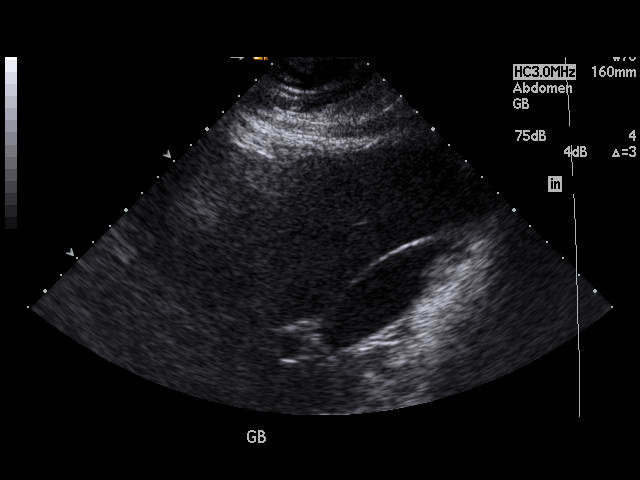

[17 of 25 positions shown; findings below may reference images not displayed]

PROCEDURE:     US  - US ABDOMEN GENERAL SURVEY  - June 12, 2011  [DATE]

RESULT:     Findings: The liver demonstrates homogeneous echotexture.
Hepatic pedal flow is identified within the portal vein. The aorta and IVC
are unremarkable. Partial visualization of the pancreas in unremarkable.
Evaluation gallbladder fossa demonstrates no evidence of pericholecystic
fluid gallstones or sludging. Gallbladder wall thickness is 2.4 mm. Common
bile that measures 4.1 mm in diameter. The spleen demonstrates a homogeneous
echotecture and measures 13.08 cm  in longitudinal dimensions. Evaluation of
the kidneys demonstrates no evidence of hydronephrosis, masses or calculi.
The right kidney measures 11.18 x 5.73 x 4.69 cm the left 12.79 x 4.87 x
5.76 cm.
IMPRESSION: Unremarkable abdominal ultrasound as described above.

## 2012-12-21 IMAGING — US US PELVIS COMPLETE
1 series · 14 of 25 positions shown · non-contrast
Comparison: None.

CLINICAL DATA: Right pelvic pain

TRANSABDOMINAL AND TRANSVAGINAL ULTRASOUND OF PELVIS
TECHNIQUE: Both transabdominal and transvaginal ultrasound
examinations of the pelvis were performed. Transabdominal technique
was performed for global imaging of the pelvis including uterus,
ovaries, adnexal regions, and pelvic cul-de-sac.

[Series 1: us pelvis complete · 0.36mm/px · 14 of 47 slices shown]
[im 1/47]
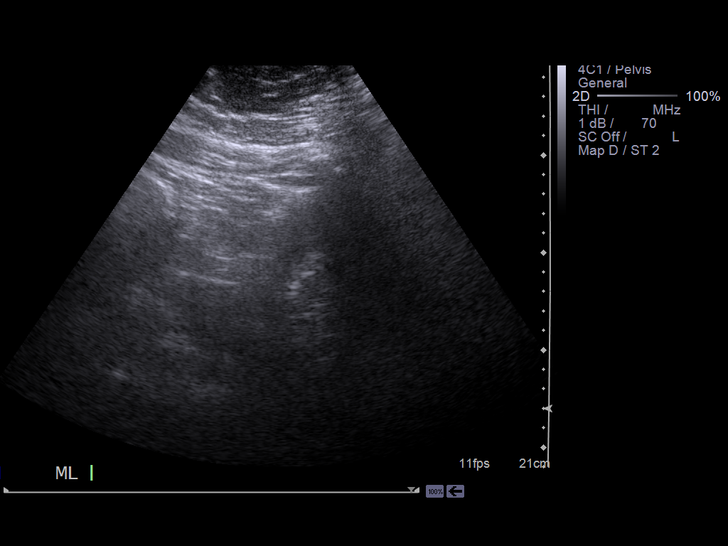
[im 4/47]
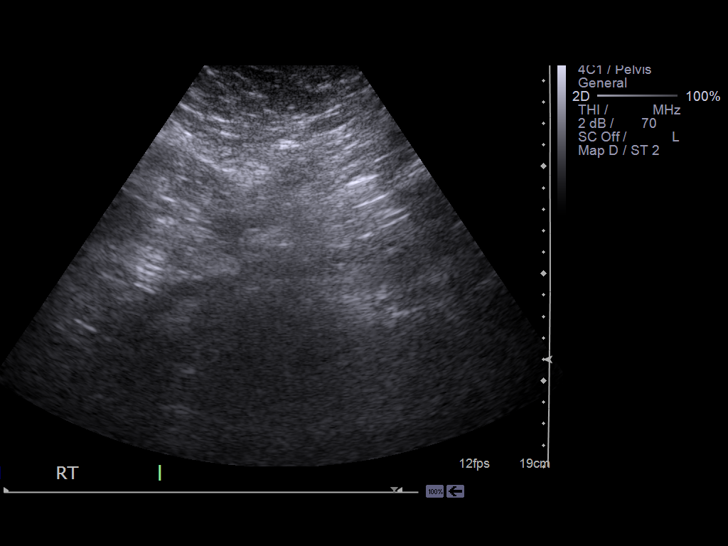
[im 8/47]
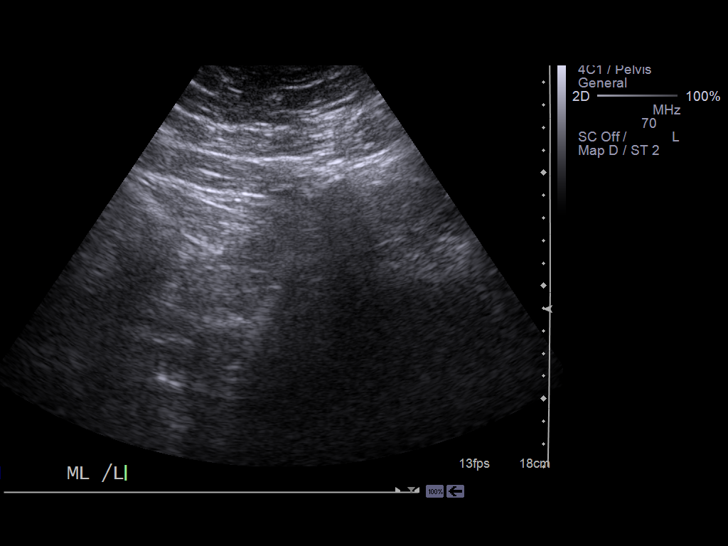
[im 12/47]
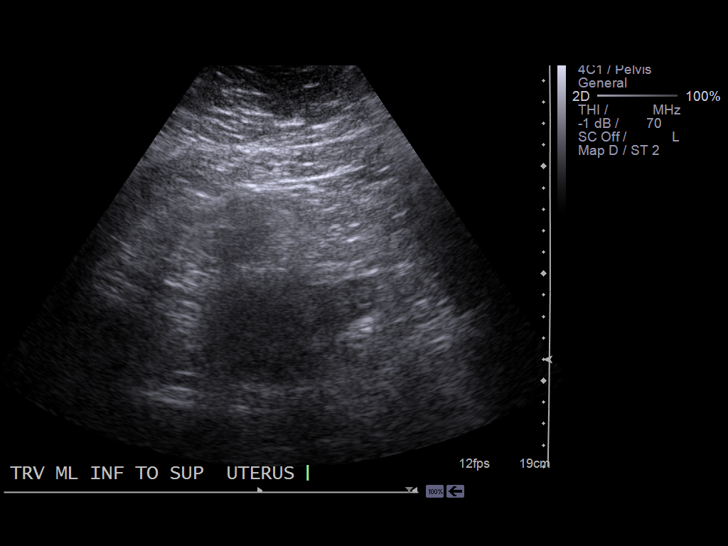
[im 16/47]
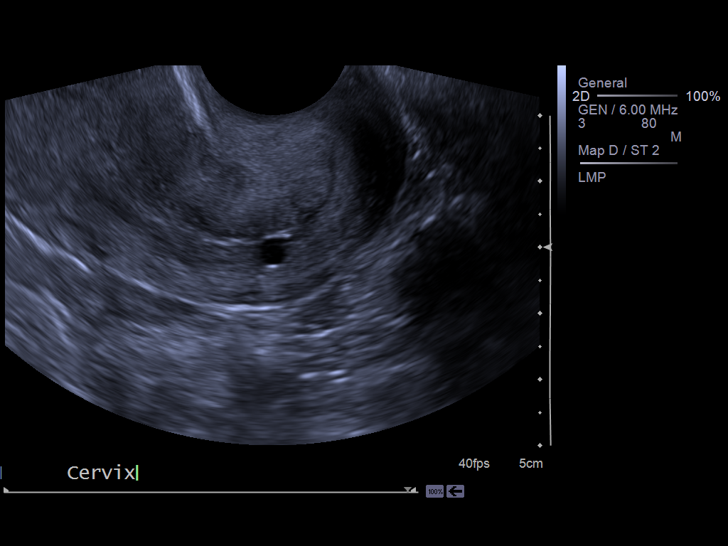
[im 18/47]
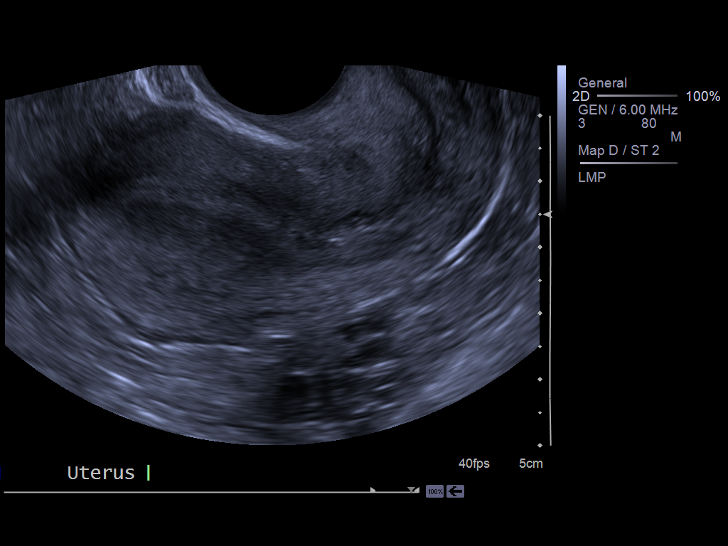
[im 22/47]
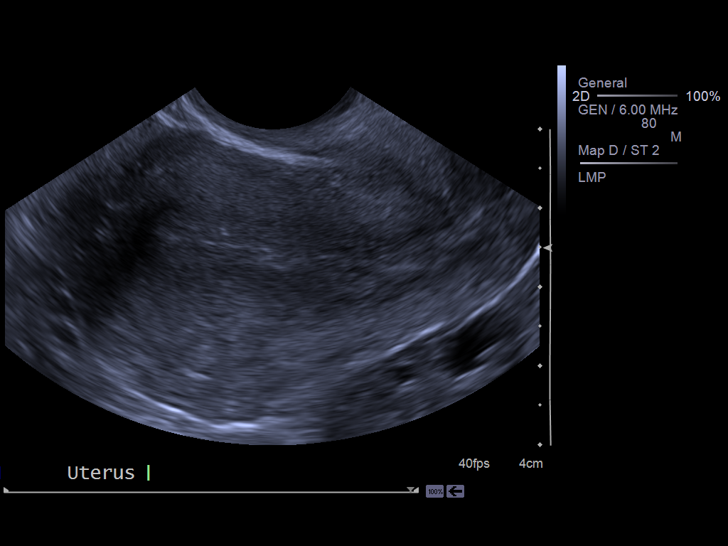
[im 25/47]
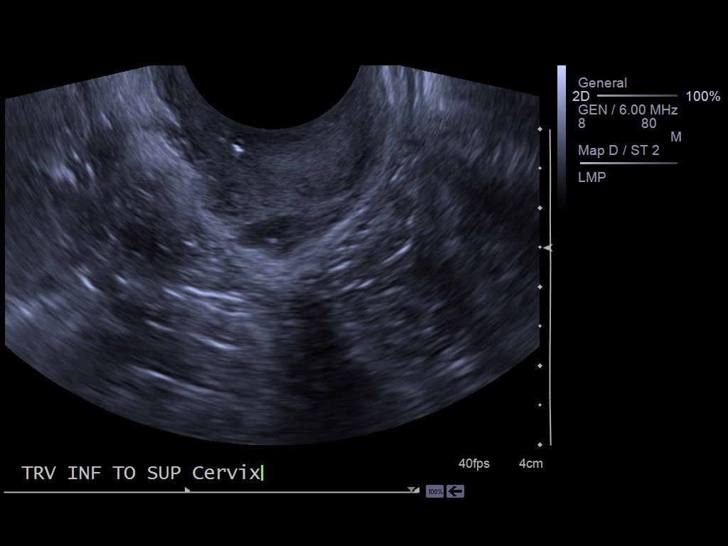
[im 29/47]
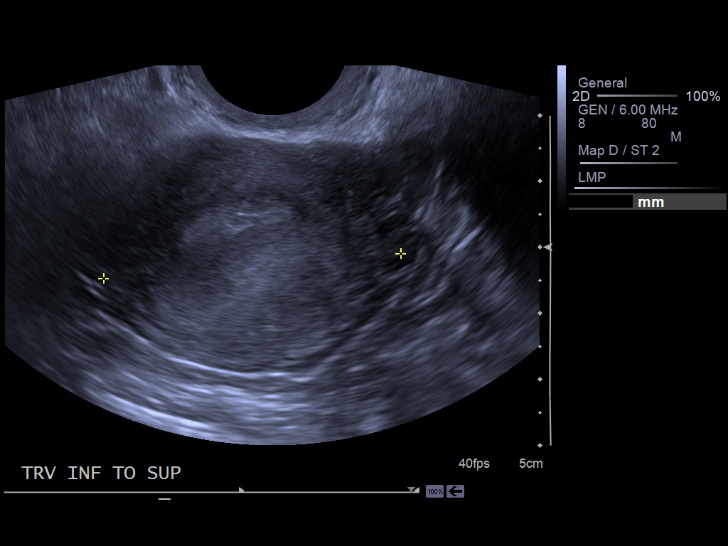
[im 31/47]
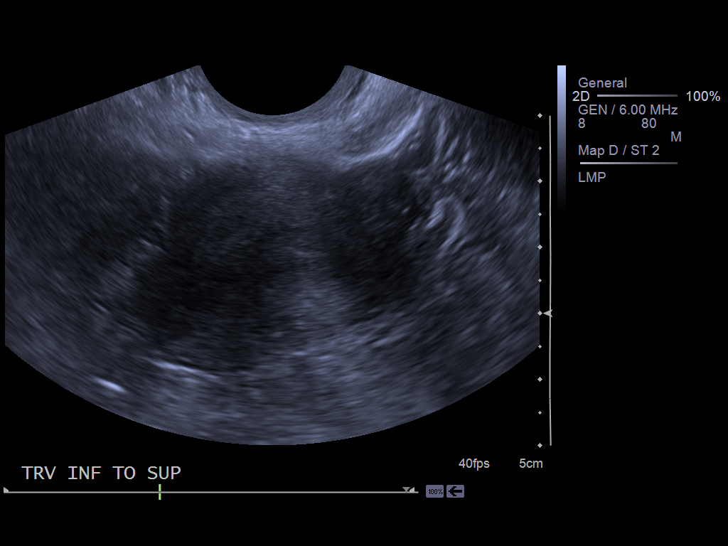
[im 35/47]
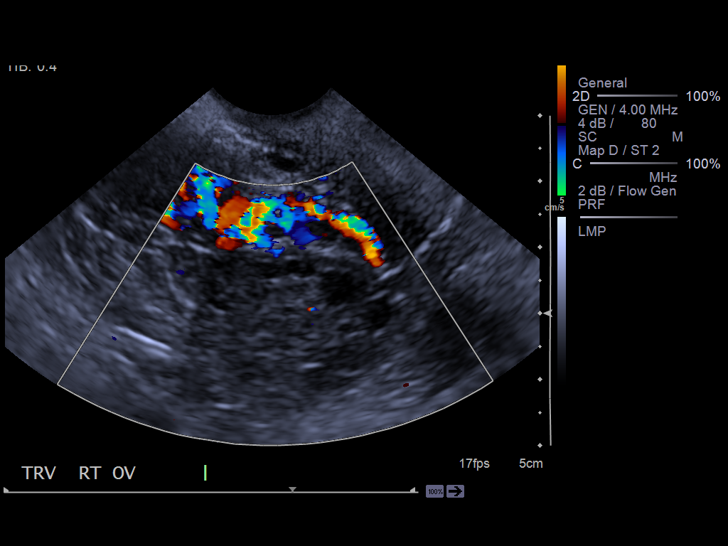
[im 39/47]
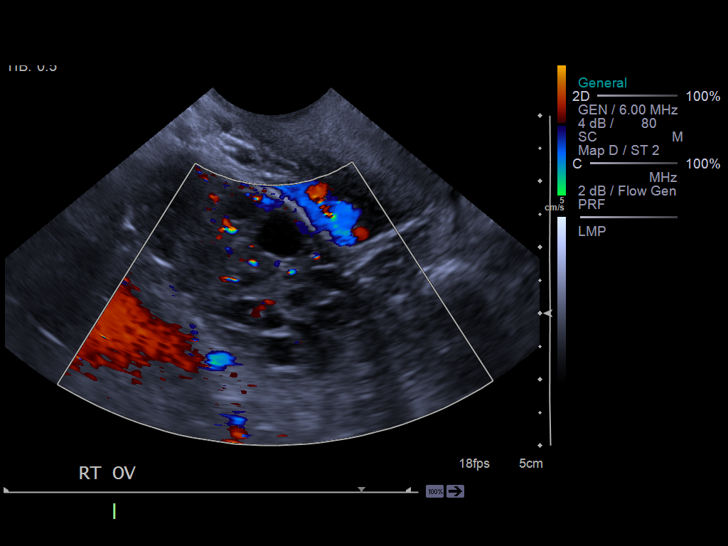
[im 43/47]
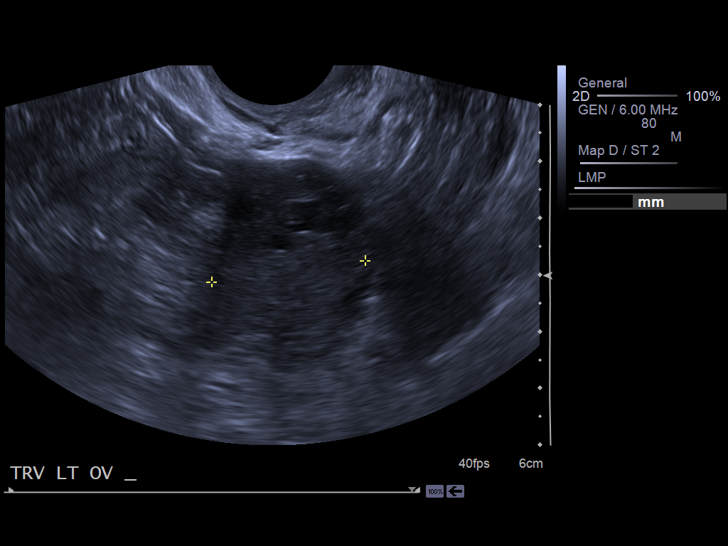
[im 47/47]
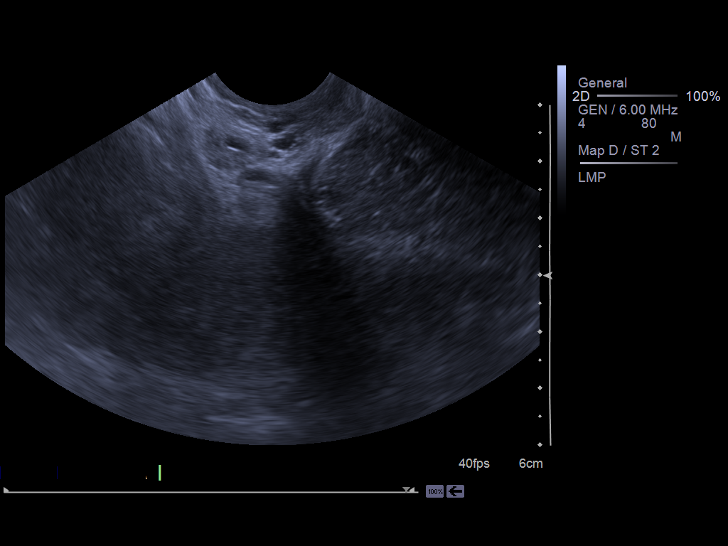

[14 of 25 positions shown; findings below may reference images not displayed]

It was necessary to proceed with endovaginal exam following the
transabdominal exam to visualize the bilateral ovaries.
FINDINGS: Uterus: Normal in size and appearance, measuring 7.0 x 3.5 x
cm.

Endometrium: Normal in thickness and appearance, measuring 5 mm.

Right ovary:  Normal appearance/no adnexal mass, measuring 3.9 x
2.0 x 2.9 cm.

Left ovary: Normal appearance/no adnexal mass, measuring 4.4 x
x 2.7 cm.

Other findings: No free fluid.
IMPRESSION: Normal study.  No evidence of pelvic mass or other significant
abnormality.
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
## 2014-10-20 NOTE — Discharge Summary (Signed)
PATIENT NAME:  Crystal House, [name] MR#:  [id number] DATE OF BIRTH:  April 11, 1991  DATE OF ADMISSION:  09/20/2011 DATE OF DISCHARGE:  09/24/2011  HISTORY OF PRESENT ILLNESS: Patient was admitted with bipolar, depressed. For further details, please see typed attached history and physical.   LABORATORY, DIAGNOSTIC AND RADIOLOGICAL DATA: Acetaminophen level was negative. Glucose, BUN, creatinine, and electrolytes within normal limits. Calcium 8.9, slightly low. Liver function tests within normal limits. Ethanol blood level negative. WBC 13.1 which she reports she has chronic high white blood cell count, otherwise CBC within normal limits. Salicylates 2.9. TSH 0.975. Drug screen is positive for cannabinoids which she has used on occasion. Follow up white blood cell count 13,700 within normal, otherwise normal CBC. Glucose, BUN, creatinine, electrolytes within normal limits again. TSH 0.970. Urinalysis is within normal limits.   HOSPITAL COURSE: Patient was placed on Depakote and reached a dose of 750 mg per day. Also was given trazodone for sleep. Over the hospitalization depressive symptoms responded. No longer had any suicidal or homicidal thoughts. Felt much better. She had little insight into some of her anger issues but she did recognize that she could be bipolar. On this occasion she reported that she would follow up and take her medication. She was not in the hospital long enough to recheck her liver on Depakote or to check the Depakote blood level which should be done on an outpatient basis. It is quite likely that she may need to increase the Depakote.   DIAGNOSES:  AXIS I: Bipolar II, depressed.   AXIS II: Personality disorder with borderline features.   AXIS III: Obesity.   CONDITION ON DISCHARGE: Good.   DISPOSITION: Patient will be [facility] at [facility].    MEDICATIONS:  1. Depakote 250 in the morning, 500 mg at bedtime. Needs blood level with liver check and CBC in a couple of  weeks.  2. Trazodone 100 mg at bedtime, may take 200 if necessary.   DIET: As tolerated.   ACTIVITY: As tolerated.   ____________________________ Venida JarvisWilliam James Ryan II, MD wjr:cms D: 09/24/2011 12:11:39 ET T: 09/26/2011 16:04:27 ET JOB#: [id number]  cc: Venida JarvisWilliam James Ryan II, MD, <Dictator> Jules HusbandsWILLIAM J RYAN MD ELECTRONICALLY SIGNED 09/30/2011 14:51
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
## 2015-06-17 ENCOUNTER — Emergency Department
Admission: EM | Admit: 2015-06-17 | Discharge: 2015-06-17 | Disposition: A | Discharge status: Home / Self Care | Payer: Medicaid Other | Attending: Emergency Medicine | Admitting: Emergency Medicine

## 2015-06-17 DIAGNOSIS — F17210 Nicotine dependence, cigarettes, uncomplicated: Secondary | ICD-10-CM | POA: Diagnosis not present

## 2015-06-17 DIAGNOSIS — M62830 Muscle spasm of back: Secondary | ICD-10-CM | POA: Diagnosis not present

## 2015-06-17 DIAGNOSIS — M5442 Lumbago with sciatica, left side: Secondary | ICD-10-CM | POA: Insufficient documentation

## 2015-06-17 DIAGNOSIS — M5432 Sciatica, left side: Secondary | ICD-10-CM

## 2015-06-17 DIAGNOSIS — M545 Low back pain: Secondary | ICD-10-CM | POA: Diagnosis present

## 2015-06-17 MED ORDER — CYCLOBENZAPRINE HCL 10 MG PO TABS
10.0000 mg | ORAL_TABLET | Freq: Once | ORAL | Status: AC
  Administered 2015-06-17: 10 mg via ORAL
  Filled 2015-06-17: qty 1

## 2015-06-17 MED ORDER — PREDNISONE 10 MG PO TABS: 10.0000 mg | ORAL_TABLET | Freq: Every day | ORAL | Status: DC

## 2015-06-17 MED ORDER — CYCLOBENZAPRINE HCL 5 MG PO TABS: 5.0000 mg | ORAL_TABLET | Freq: Three times a day (TID) | ORAL | Status: DC | PRN

## 2015-06-17 MED ORDER — PREDNISONE 20 MG PO TABS
60.0000 mg | ORAL_TABLET | Freq: Once | ORAL | Status: AC
  Administered 2015-06-17: 60 mg via ORAL
  Filled 2015-06-17: qty 3

## 2015-06-17 NOTE — ED Notes (Signed)
E-signature box not working. Pt verbalized understanding of discharge instructions. 

## 2015-06-17 NOTE — Discharge Instructions (Signed)
Muscle Cramps and Spasms Muscle cramps and spasms are when muscles tighten by themselves. They usually get better within minutes. Muscle cramps are painful. They are usually stronger and last longer than muscle spasms. Muscle spasms may or may not be painful. They can last a few seconds or much longer. HOME CARE  Drink enough fluid to keep your pee (urine) clear or pale yellow.  Massage, stretch, and relax the muscle.  Use a warm towel, heating pad, or warm shower water on tight muscles.  Place ice on the muscle if it is tender or in pain.  Put ice in a plastic bag.  Place a towel between your skin and the bag.  Leave the ice on for 15-20 minutes, 03-04 times a day.  Only take medicine as told by your doctor. GET HELP RIGHT AWAY IF:  Your cramps or spasms get worse, happen more often, or do not get better with time. MAKE SURE YOU:  Understand these instructions.  Will watch your condition.  Will get help right away if you are not doing well or get worse.   This information is not intended to replace advice given to you by your health care provider. Make sure you discuss any questions you have with your health care provider.   Document Released: 05/27/2008 Document Revised: 10/09/2012 Document Reviewed: 05/31/2012 [employer] Interactive Patient Education 2016 [employer]  Sciatica With Rehab The sciatic nerve runs from the back down the leg and is responsible for sensation and control of the muscles in the back (posterior) side of the thigh, lower leg, and foot. Sciatica is a condition that is characterized by inflammation of this nerve.  SYMPTOMS   Signs of nerve damage, including numbness and/or weakness along the posterior side of the lower extremity.  Pain in the back of the thigh that may also travel down the leg.  Pain that worsens when sitting for long periods of time.  Occasionally, pain in the back or buttock. CAUSES  Inflammation of the sciatic nerve is the  cause of sciatica. The inflammation is due to something irritating the nerve. Common sources of irritation include:  Sitting for long periods of time.  Direct trauma to the nerve.  Arthritis of the spine.  Herniated or ruptured disk.  Slipping of the vertebrae (spondylolisthesis).  Pressure from soft tissues, such as muscles or ligament-like tissue (fascia). RISK INCREASES WITH:  Sports that place pressure or stress on the spine (football or weightlifting).  Poor strength and flexibility.  Failure to warm up properly before activity.  Family history of low back pain or disk disorders.  Previous back injury or surgery.  Poor body mechanics, especially when lifting, or poor posture. PREVENTION   Warm up and stretch properly before activity.  Maintain physical fitness:  Strength, flexibility, and endurance.  Cardiovascular fitness.  Learn and use proper technique, especially with posture and lifting. When possible, have coach correct improper technique.  Avoid activities that place stress on the spine. PROGNOSIS If treated properly, then sciatica usually resolves within 6 weeks. However, occasionally surgery is necessary.  RELATED COMPLICATIONS   Permanent nerve damage, including pain, numbness, tingle, or weakness.  Chronic back pain.  Risks of surgery: infection, bleeding, nerve damage, or damage to surrounding tissues. TREATMENT Treatment initially involves resting from any activities that aggravate your symptoms. The use of ice and medication may help reduce pain and inflammation. The use of strengthening and stretching exercises may help reduce pain with activity. These exercises may be performed at  home or with referral to a therapist. A therapist may recommend further treatments, such as transcutaneous electronic nerve stimulation (TENS) or ultrasound. Your caregiver may recommend corticosteroid injections to help reduce inflammation of the sciatic nerve. If  symptoms persist despite non-surgical (conservative) treatment, then surgery may be recommended. MEDICATION  If pain medication is necessary, then nonsteroidal anti-inflammatory medications, such as aspirin and ibuprofen, or other minor pain relievers, such as acetaminophen, are often recommended.  Do not take pain medication for 7 days before surgery.  Prescription pain relievers may be given if deemed necessary by your caregiver. Use only as directed and only as much as you need.  Ointments applied to the skin may be helpful.  Corticosteroid injections may be given by your caregiver. These injections should be reserved for the most serious cases, because they may only be given a certain number of times. HEAT AND COLD  Cold treatment (icing) relieves pain and reduces inflammation. Cold treatment should be applied for 10 to 15 minutes every 2 to 3 hours for inflammation and pain and immediately after any activity that aggravates your symptoms. Use ice packs or massage the area with a piece of ice (ice massage).  Heat treatment may be used prior to performing the stretching and strengthening activities prescribed by your caregiver, physical therapist, or athletic trainer. Use a heat pack or soak the injury in warm water. SEEK MEDICAL CARE IF:  Treatment seems to offer no benefit, or the condition worsens.  Any medications produce adverse side effects. EXERCISES  RANGE OF MOTION (ROM) AND STRETCHING EXERCISES - Sciatica Most people with sciatic will find that their symptoms worsen with either excessive bending forward (flexion) or arching at the low back (extension). The exercises which will help resolve your symptoms will focus on the opposite motion. Your physician, physical therapist or athletic trainer will help you determine which exercises will be most helpful to resolve your low back pain. Do not complete any exercises without first consulting with your clinician. Discontinue any  exercises which worsen your symptoms until you speak to your clinician. If you have pain, numbness or tingling which travels down into your buttocks, leg or foot, the goal of the therapy is for these symptoms to move closer to your back and eventually resolve. Occasionally, these leg symptoms will get better, but your low back pain may worsen; this is typically an indication of progress in your rehabilitation. Be certain to be very alert to any changes in your symptoms and the activities in which you participated in the 24 hours prior to the change. Sharing this information with your clinician will allow him/her to most efficiently treat your condition. These exercises may help you when beginning to rehabilitate your injury. Your symptoms may resolve with or without further involvement from your physician, physical therapist or athletic trainer. While completing these exercises, remember:   Restoring tissue flexibility helps normal motion to return to the joints. This allows healthier, less painful movement and activity.  An effective stretch should be held for at least 30 seconds.  A stretch should never be painful. You should only feel a gentle lengthening or release in the stretched tissue. FLEXION RANGE OF MOTION AND STRETCHING EXERCISES: STRETCH - Flexion, Single Knee to Chest   Lie on a firm bed or floor with both legs extended in front of you.  Keeping one leg in contact with the floor, bring your opposite knee to your chest. Hold your leg in place by either grabbing behind your thigh  or at your knee.  Pull until you feel a gentle stretch in your low back. Hold __________ seconds.  Slowly release your grasp and repeat the exercise with the opposite side. Repeat __________ times. Complete this exercise __________ times per day.  STRETCH - Flexion, Double Knee to Chest  Lie on a firm bed or floor with both legs extended in front of you.  Keeping one leg in contact with the floor, bring  your opposite knee to your chest.  Tense your stomach muscles to support your back and then lift your other knee to your chest. Hold your legs in place by either grabbing behind your thighs or at your knees.  Pull both knees toward your chest until you feel a gentle stretch in your low back. Hold __________ seconds.  Tense your stomach muscles and slowly return one leg at a time to the floor. Repeat __________ times. Complete this exercise __________ times per day.  STRETCH - Low Trunk Rotation   Lie on a firm bed or floor. Keeping your legs in front of you, bend your knees so they are both pointed toward the ceiling and your feet are flat on the floor.  Extend your arms out to the side. This will stabilize your upper body by keeping your shoulders in contact with the floor.  Gently and slowly drop both knees together to one side until you feel a gentle stretch in your low back. Hold for __________ seconds.  Tense your stomach muscles to support your low back as you bring your knees back to the starting position. Repeat the exercise to the other side. Repeat __________ times. Complete this exercise __________ times per day  EXTENSION RANGE OF MOTION AND FLEXIBILITY EXERCISES: STRETCH - Extension, Prone on Elbows  Lie on your stomach on the floor, a bed will be too soft. Place your palms about shoulder width apart and at the height of your head.  Place your elbows under your shoulders. If this is too painful, stack pillows under your chest.  Allow your body to relax so that your hips drop lower and make contact more completely with the floor.  Hold this position for __________ seconds.  Slowly return to lying flat on the floor. Repeat __________ times. Complete this exercise __________ times per day.  RANGE OF MOTION - Extension, Prone Press Ups  Lie on your stomach on the floor, a bed will be too soft. Place your palms about shoulder width apart and at the height of your  head.  Keeping your back as relaxed as possible, slowly straighten your elbows while keeping your hips on the floor. You may adjust the placement of your hands to maximize your comfort. As you gain motion, your hands will come more underneath your shoulders.  Hold this position __________ seconds.  Slowly return to lying flat on the floor. Repeat __________ times. Complete this exercise __________ times per day.  STRENGTHENING EXERCISES - Sciatica  These exercises may help you when beginning to rehabilitate your injury. These exercises should be done near your "sweet spot." This is the neutral, low-back arch, somewhere between fully rounded and fully arched, that is your least painful position. When performed in this safe range of motion, these exercises can be used for people who have either a flexion or extension based injury. These exercises may resolve your symptoms with or without further involvement from your physician, physical therapist or athletic trainer. While completing these exercises, remember:   Muscles can gain both the endurance  and the strength needed for everyday activities through controlled exercises.  Complete these exercises as instructed by your physician, physical therapist or athletic trainer. Progress with the resistance and repetition exercises only as your caregiver advises.  You may experience muscle soreness or fatigue, but the pain or discomfort you are trying to eliminate should never worsen during these exercises. If this pain does worsen, stop and make certain you are following the directions exactly. If the pain is still present after adjustments, discontinue the exercise until you can discuss the trouble with your clinician. STRENGTHENING - Deep Abdominals, Pelvic Tilt   Lie on a firm bed or floor. Keeping your legs in front of you, bend your knees so they are both pointed toward the ceiling and your feet are flat on the floor.  Tense your lower abdominal  muscles to press your low back into the floor. This motion will rotate your pelvis so that your tail bone is scooping upwards rather than pointing at your feet or into the floor.  With a gentle tension and even breathing, hold this position for __________ seconds. Repeat __________ times. Complete this exercise __________ times per day.  STRENGTHENING - Abdominals, Crunches   Lie on a firm bed or floor. Keeping your legs in front of you, bend your knees so they are both pointed toward the ceiling and your feet are flat on the floor. Cross your arms over your chest.  Slightly tip your chin down without bending your neck.  Tense your abdominals and slowly lift your trunk high enough to just clear your shoulder blades. Lifting higher can put excessive stress on the low back and does not further strengthen your abdominal muscles.  Control your return to the starting position. Repeat __________ times. Complete this exercise __________ times per day.  STRENGTHENING - Quadruped, Opposite UE/LE Lift  Assume a hands and knees position on a firm surface. Keep your hands under your shoulders and your knees under your hips. You may place padding under your knees for comfort.  Find your neutral spine and gently tense your abdominal muscles so that you can maintain this position. Your shoulders and hips should form a rectangle that is parallel with the floor and is not twisted.  Keeping your trunk steady, lift your right hand no higher than your shoulder and then your left leg no higher than your hip. Make sure you are not holding your breath. Hold this position __________ seconds.  Continuing to keep your abdominal muscles tense and your back steady, slowly return to your starting position. Repeat with the opposite arm and leg. Repeat __________ times. Complete this exercise __________ times per day.  STRENGTHENING - Abdominals and Quadriceps, Straight Leg Raise   Lie on a firm bed or floor with both  legs extended in front of you.  Keeping one leg in contact with the floor, bend the other knee so that your foot can rest flat on the floor.  Find your neutral spine, and tense your abdominal muscles to maintain your spinal position throughout the exercise.  Slowly lift your straight leg off the floor about 6 inches for a count of 15, making sure to not hold your breath.  Still keeping your neutral spine, slowly lower your leg all the way to the floor. Repeat this exercise with each leg __________ times. Complete this exercise __________ times per day. POSTURE AND BODY MECHANICS CONSIDERATIONS - Sciatica Keeping correct posture when sitting, standing or completing your activities will reduce the stress put  on different body tissues, allowing injured tissues a chance to heal and limiting painful experiences. The following are general guidelines for improved posture. Your physician or physical therapist will provide you with any instructions specific to your needs. While reading these guidelines, remember:  The exercises prescribed by your provider will help you have the flexibility and strength to maintain correct postures.  The correct posture provides the optimal environment for your joints to work. All of your joints have less wear and tear when properly supported by a spine with good posture. This means you will experience a healthier, less painful body.  Correct posture must be practiced with all of your activities, especially prolonged sitting and standing. Correct posture is as important when doing repetitive low-stress activities (typing) as it is when doing a single heavy-load activity (lifting). RESTING POSITIONS Consider which positions are most painful for you when choosing a resting position. If you have pain with flexion-based activities (sitting, bending, stooping, squatting), choose a position that allows you to rest in a less flexed posture. You would want to avoid curling into a  fetal position on your side. If your pain worsens with extension-based activities (prolonged standing, working overhead), avoid resting in an extended position such as sleeping on your stomach. Most people will find more comfort when they rest with their spine in a more neutral position, neither too rounded nor too arched. Lying on a non-sagging bed on your side with a pillow between your knees, or on your back with a pillow under your knees will often provide some relief. Keep in mind, being in any one position for a prolonged period of time, no matter how correct your posture, can still lead to stiffness. PROPER SITTING POSTURE In order to minimize stress and discomfort on your spine, you must sit with correct posture Sitting with good posture should be effortless for a healthy body. Returning to good posture is a gradual process. Many people can work toward this most comfortably by using various supports until they have the flexibility and strength to maintain this posture on their own. When sitting with proper posture, your ears will fall over your shoulders and your shoulders will fall over your hips. You should use the back of the chair to support your upper back. Your low back will be in a neutral position, just slightly arched. You may place a small pillow or folded towel at the base of your low back for support.  When working at a desk, create an environment that supports good, upright posture. Without extra support, muscles fatigue and lead to excessive strain on joints and other tissues. Keep these recommendations in mind: CHAIR:   A chair should be able to slide under your desk when your back makes contact with the back of the chair. This allows you to work closely.  The chair's height should allow your eyes to be level with the upper part of your monitor and your hands to be slightly lower than your elbows. BODY POSITION  Your feet should make contact with the floor. If this is not possible,  use a foot rest.  Keep your ears over your shoulders. This will reduce stress on your neck and low back. INCORRECT SITTING POSTURES   If you are feeling tired and unable to assume a healthy sitting posture, do not slouch or slump. This puts excessive strain on your back tissues, causing more damage and pain. Healthier options include:  Using more support, like a lumbar pillow.  Switching tasks  to something that requires you to be upright or walking.  Talking a brief walk.  Lying down to rest in a neutral-spine position. PROLONGED STANDING WHILE SLIGHTLY LEANING FORWARD  When completing a task that requires you to lean forward while standing in one place for a long time, place either foot up on a stationary 2-4 inch high object to help maintain the best posture. When both feet are on the ground, the low back tends to lose its slight inward curve. If this curve flattens (or becomes too large), then the back and your other joints will experience too much stress, fatigue more quickly and can cause pain.  CORRECT STANDING POSTURES Proper standing posture should be assumed with all daily activities, even if they only take a few moments, like when brushing your teeth. As in sitting, your ears should fall over your shoulders and your shoulders should fall over your hips. You should keep a slight tension in your abdominal muscles to brace your spine. Your tailbone should point down to the ground, not behind your body, resulting in an over-extended swayback posture.  INCORRECT STANDING POSTURES  Common incorrect standing postures include a forward head, locked knees and/or an excessive swayback. WALKING Walk with an upright posture. Your ears, shoulders and hips should all line-up. PROLONGED ACTIVITY IN A FLEXED POSITION When completing a task that requires you to bend forward at your waist or lean over a low surface, try to find a way to stabilize 3 of 4 of your limbs. You can place a hand or elbow on  your thigh or rest a knee on the surface you are reaching across. This will provide you more stability so that your muscles do not fatigue as quickly. By keeping your knees relaxed, or slightly bent, you will also reduce stress across your low back. CORRECT LIFTING TECHNIQUES DO :   Assume a wide stance. This will provide you more stability and the opportunity to get as close as possible to the object which you are lifting.  Tense your abdominals to brace your spine; then bend at the knees and hips. Keeping your back locked in a neutral-spine position, lift using your leg muscles. Lift with your legs, keeping your back straight.  Test the weight of unknown objects before attempting to lift them.  Try to keep your elbows locked down at your sides in order get the best strength from your shoulders when carrying an object.  Always ask for help when lifting heavy or awkward objects. INCORRECT LIFTING TECHNIQUES DO NOT:   Lock your knees when lifting, even if it is a small object.  Bend and twist. Pivot at your feet or move your feet when needing to change directions.  Assume that you cannot safely pick up a paperclip without proper posture.   This information is not intended to replace advice given to you by your health care provider. Make sure you discuss any questions you have with your health care provider.   Document Released: 06/14/2005 Document Revised: 10/29/2014 Document Reviewed: 09/26/2008 [employer] Interactive Patient Education [employer]

## 2015-06-17 NOTE — ED Provider Notes (Signed)
CSN: [id number]     Arrival date & time 06/17/15  2019 History   First MD Initiated Contact with Patient 06/17/15 2050     Chief Complaint  Patient presents with  . Back Pain     (Consider location/radiation/quality/duration/timing/severity/associated sxs/prior Treatment) HPI  24 year old female presents to the department for evaluation of left lower back pain. She describes spasms in the left lower back with shooting pain 1 on the posterior aspect of the left leg. It doesn't present for 2 weeks. No trauma or injury. She denies any abdominal pain, urinary symptoms, fevers. Symptoms are increased with standing. She gets relief with lying down. She is not taking any medications except for Tylenol was has not given her much relief. Her pain is moderate. She presents to the ER today because the pain became severe. She is able to walk has pain with bending and standing for long periods. She was unable to work today. Eyes any weakness, numbness, loss of bowel or bladder since. Past Medical History  Diagnosis Date  . Asthma   . Bipolar disorder     Pt is supposed to be taking medications and is not.    Past Surgical History  Procedure Laterality Date  . Tonsillectomy    . Tympanostomy tube placement     No family history on file. Social History  Substance Use Topics  . Smoking status: Current Every Day Smoker    Types: Cigarettes  . Smokeless tobacco: Not on file  . Alcohol Use: Yes     Comment: occasionally   OB History    No data available     Review of Systems  Constitutional: Negative for fever, chills, activity change and fatigue.  HENT: Negative for congestion, sinus pressure and sore throat.   Eyes: Negative for visual disturbance.  Respiratory: Negative for cough, chest tightness and shortness of breath.   Cardiovascular: Negative for chest pain and leg swelling.  Gastrointestinal: Negative for nausea, vomiting, abdominal pain and diarrhea.  Genitourinary: Negative for  dysuria.  Musculoskeletal: Positive for back pain. Negative for arthralgias and gait problem.  Skin: Negative for rash.  Neurological: Negative for weakness, numbness and headaches.  Hematological: Negative for adenopathy.  Psychiatric/Behavioral: Negative for behavioral problems, confusion and agitation.      Allergies  Ibuprofen and Other  Home Medications   Prior to Admission medications   Medication Sig Start Date End Date Taking? Authorizing Provider  cyclobenzaprine (FLEXERIL) 5 MG tablet Take 1 tablet (5 mg total) by mouth every 8 (eight) hours as needed for muscle spasms. 06/17/15   Evon Slack, PA-C  predniSONE (DELTASONE) 10 MG tablet Take 1 tablet (10 mg total) by mouth daily. 6,5,4,3,2,1 six day taper 06/17/15   Evon Slack, PA-C  traMADol (ULTRAM) 50 MG tablet Take 50 mg by mouth every 6 (six) hours as needed. For pain    Historical Provider, MD   BP 136/67 mmHg  Pulse 75  Temp(Src) 97.6 F (36.4 C) (Oral)  Resp 18  Ht  (1.651 m)  Wt 142.883 kg  BMI 52.42 kg/m2  SpO2 100%  LMP 05/06/2015 Physical Exam  Constitutional: She is oriented to person, place, and time. She appears well-developed and well-nourished. No distress.  HENT:  Head: Normocephalic and atraumatic.  Mouth/Throat: Oropharynx is clear and moist.  Eyes: EOM are normal. Pupils are equal, round, and reactive to light. Right eye exhibits no discharge. Left eye exhibits no discharge.  Neck: Normal range of motion. Neck supple.  Cardiovascular:  Normal rate, regular rhythm and intact distal pulses.   Pulmonary/Chest: Effort normal and breath sounds normal. No respiratory distress. She exhibits no tenderness.  Abdominal: Soft. She exhibits no distension. There is no tenderness.  Musculoskeletal:  Examination lumbar spine shows patient has no spinous process tenderness. She has left paravertebral muscle tenderness. She has good range of motion with flexion and extension lateral bending and  rotation. She is tender in the left sciatic notch. Examination of lower extremity shows patient has full range of motion of the hip and knees and ankles. No weakness with hip abduction, adduction, knee flexion, knee extension, ankle plantarflexion, ankle dorsiflexion, extensor hallucis longus. Sensation is intact throughout. She has a positive straight leg raise on the right. No swelling warmth erythema throughout the lower extremities.  Neurological: She is alert and oriented to person, place, and time. She has normal reflexes.  Skin: Skin is warm and dry.  Psychiatric: She has a normal mood and affect. Her behavior is normal. Thought content normal.    ED Course  Procedures (including critical care time) Labs Review Labs Reviewed - No data to display  Imaging Review No results found. I have personally reviewed and evaluated these images and lab results as part of my medical decision-making.   EKG Interpretation None      MDM   Final diagnoses:  Sciatica associated with disorder of lumbar spine, left  Lumbar paraspinal muscle spasm    24 year old female with left lumbar paravertebral muscle spasms and mild left S1 radiculopathy. No weakness or neurological deficits. Patient will start 6 day prednisone taper followed by ibuprofen. She is given Flexeril for muscle spasms. She'll follow-up with orthopedics if no improvement in 5-7 days. Return to the ER for any worsening symptoms urgent changes in her health.    Evon Slackhomas C Gaines, PA-C 06/17/15 2132  Jennye MoccasinBrian S Quigley, MD 06/17/15 [phone]

## 2015-06-17 NOTE — ED Notes (Addendum)
Pt in with co lower back pain radiating to left worsens when she moves or walks.  Hx of back problems denies any injury.   Pt state she is basically here because she missed worked today and needs note for work to tell boss her back hurts.

## 2015-06-19 MED ORDER — PIPERACILLIN-TAZOBACTAM 3.375 G IVPB
INTRAVENOUS | Status: AC
  Filled 2015-06-19: qty 50

## 2015-07-15 ENCOUNTER — Encounter: Payer: Self-pay | Admitting: Emergency Medicine

## 2015-07-15 ENCOUNTER — Emergency Department
Admission: EM | Admit: 2015-07-15 | Discharge: 2015-07-15 | Disposition: A | Discharge status: Home / Self Care | Payer: Medicaid Other | Attending: Emergency Medicine | Admitting: Emergency Medicine

## 2015-07-15 DIAGNOSIS — F17210 Nicotine dependence, cigarettes, uncomplicated: Secondary | ICD-10-CM | POA: Diagnosis not present

## 2015-07-15 DIAGNOSIS — K1379 Other lesions of oral mucosa: Secondary | ICD-10-CM | POA: Insufficient documentation

## 2015-07-15 DIAGNOSIS — K120 Recurrent oral aphthae: Secondary | ICD-10-CM | POA: Diagnosis not present

## 2015-07-15 MED ORDER — VALACYCLOVIR HCL 500 MG PO TABS
1000.0000 mg | ORAL_TABLET | Freq: Once | ORAL | Status: AC
  Administered 2015-07-15: 1000 mg via ORAL
  Filled 2015-07-15: qty 2

## 2015-07-15 MED ORDER — VALACYCLOVIR HCL 1 G PO TABS: 1000.0000 mg | ORAL_TABLET | Freq: Three times a day (TID) | ORAL | Status: DC

## 2015-07-15 NOTE — Discharge Instructions (Signed)
1. Take Valtrex 1 g 3 times daily 7 days. 2. Return to the ER for worsening symptoms, persistent vomiting, fever or other concerns.

## 2015-07-15 NOTE — ED Notes (Signed)
Patient ambulatory to triage with steady gait, without difficulty or distress noted; pt reports painful mouth sores x 2 days; st her boyfriend has herpes

## 2015-07-15 NOTE — ED Notes (Signed)

## 2015-07-15 NOTE — ED Provider Notes (Signed)
[facility] Emergency Department Provider Note  ____________________________________________  Time seen: Approximately 2:56 AM  I have reviewed the triage vital signs and the nursing notes.   HISTORY  Chief Complaint Mouth Lesions    HPI Crystal House is a 25 y.o. female who presents to the ED from home with a chief complaint mouth sores. Patient notes painful mouth sores 2 days. States her boyfriend was recently diagnosed with herpes. Denies associated fever, chills, vomiting, abdominal pain, dysuria, vaginal discharge, genital lesions. Nothing makes her mouth sores better or worse.   Past Medical History  Diagnosis Date  . Asthma   . Bipolar disorder (HCC)     Pt is supposed to be taking medications and is not.     There are no active problems to display for this patient.   Past Surgical History  Procedure Laterality Date  . Tonsillectomy    . Tympanostomy tube placement      Current Outpatient Rx  Name  Route  Sig  Dispense  Refill  . cyclobenzaprine (FLEXERIL) 5 MG tablet   Oral   Take 1 tablet (5 mg total) by mouth every 8 (eight) hours as needed for muscle spasms.   30 tablet   1   . predniSONE (DELTASONE) 10 MG tablet   Oral   Take 1 tablet (10 mg total) by mouth daily. 6,5,4,3,2,1 six day taper   21 tablet   0   . traMADol (ULTRAM) 50 MG tablet   Oral   Take 50 mg by mouth every 6 (six) hours as needed. For pain           Allergies Ibuprofen and Other  No family history on file.  Social History Social History  Substance Use Topics  . Smoking status: Current Every Day Smoker    Types: Cigarettes  . Smokeless tobacco: None  . Alcohol Use: Yes     Comment: occasionally    Review of Systems Constitutional: No fever/chills Eyes: No visual changes. ENT: Positive for sores on mouth. No sore throat. Cardiovascular: Denies chest pain. Respiratory: Denies shortness of breath. Gastrointestinal: No abdominal pain.  No  nausea, no vomiting.  No diarrhea.  No constipation. Genitourinary: Negative for dysuria. Musculoskeletal: Negative for back pain. Skin: Negative for rash. Neurological: Negative for headaches, focal weakness or numbness.  10-point ROS otherwise negative.  ____________________________________________   PHYSICAL EXAM:  VITAL SIGNS: ED Triage Vitals  Enc Vitals Group     BP 07/15/15 0039 135/77 mmHg     Pulse Rate 07/15/15 0039 108     Resp 07/15/15 0039 22     Temp 07/15/15 0039 98 F (36.7 C)     Temp Source 07/15/15 0039 Oral     SpO2 07/15/15 0039 100 %     Weight 07/15/15 0039 315 lb (142.883 kg)     Height 07/15/15 0039  (1.651 m)     Head Cir --      Peak Flow --      Pain Score 07/15/15 0037 6     Pain Loc --      Pain Edu? --      Excl. in GC? --     Constitutional: Alert and oriented. Well appearing and in no acute distress. Eyes: Conjunctivae are normal. PERRL. EOMI. Head: Atraumatic. Nose: No congestion/rhinnorhea. Mouth/Throat: Mucous membranes are moist.  Oropharynx non-erythematous.  Aphthous ulcer on inner upper and inner lower lip. No vesicles noted. Neck: No stridor.   Cardiovascular: Normal rate,  regular rhythm. Grossly normal heart sounds.  Good peripheral circulation. Respiratory: Normal respiratory effort.  No retractions. Lungs CTAB. Gastrointestinal: Soft and nontender. No distention. No abdominal bruits. No CVA tenderness. Musculoskeletal: No lower extremity tenderness nor edema.  No joint effusions. Neurologic:  Normal speech and language. No gross focal neurologic deficits are appreciated. No gait instability. Skin:  Skin is warm, dry and intact. No rash noted. Psychiatric: Mood and affect are normal. Speech and behavior are normal.  ____________________________________________   LABS (all labs ordered are listed, but only abnormal results are displayed)  Labs Reviewed - No data to  display ____________________________________________  EKG  None  ____________________________________________  RADIOLOGY  None ____________________________________________   PROCEDURES  Procedure(s) performed: None  Critical Care performed: No  ____________________________________________   INITIAL IMPRESSION / ASSESSMENT AND PLAN / ED COURSE  Pertinent labs & imaging results that were available during my care of the patient were reviewed by me and considered in my medical decision making (see chart for details).  25 year old female who presents with mouth sores; boyfriend recently diagnosed with herpes. Will treat empirically with Valtrex. Strict return precautions given. Patient verbalizes understanding and agrees with plan of care. ____________________________________________   FINAL CLINICAL IMPRESSION(S) / ED DIAGNOSES  Final diagnoses:  Mouth sores      Irean Hong, MD 07/15/15 [phone]

## 2015-12-05 ENCOUNTER — Encounter: Payer: Self-pay | Admitting: Emergency Medicine

## 2015-12-05 ENCOUNTER — Emergency Department
Admission: EM | Admit: 2015-12-05 | Discharge: 2015-12-05 | Disposition: A | Discharge status: Home / Self Care | Payer: Medicaid Other | Attending: Emergency Medicine | Admitting: Emergency Medicine

## 2015-12-05 DIAGNOSIS — Z79899 Other long term (current) drug therapy: Secondary | ICD-10-CM | POA: Diagnosis not present

## 2015-12-05 DIAGNOSIS — F17210 Nicotine dependence, cigarettes, uncomplicated: Secondary | ICD-10-CM | POA: Insufficient documentation

## 2015-12-05 DIAGNOSIS — J45909 Unspecified asthma, uncomplicated: Secondary | ICD-10-CM | POA: Insufficient documentation

## 2015-12-05 DIAGNOSIS — Z91040 Latex allergy status: Secondary | ICD-10-CM | POA: Diagnosis not present

## 2015-12-05 DIAGNOSIS — F319 Bipolar disorder, unspecified: Secondary | ICD-10-CM | POA: Insufficient documentation

## 2015-12-05 DIAGNOSIS — F1290 Cannabis use, unspecified, uncomplicated: Secondary | ICD-10-CM | POA: Diagnosis not present

## 2015-12-05 DIAGNOSIS — R110 Nausea: Secondary | ICD-10-CM

## 2015-12-05 DIAGNOSIS — R197 Diarrhea, unspecified: Secondary | ICD-10-CM | POA: Insufficient documentation

## 2015-12-05 MED ORDER — LOPERAMIDE HCL 2 MG PO TABS: 2.0000 mg | ORAL_TABLET | Freq: Four times a day (QID) | ORAL | Status: DC | PRN

## 2015-12-05 MED ORDER — ONDANSETRON 4 MG PO TBDP: 4.0000 mg | ORAL_TABLET | Freq: Three times a day (TID) | ORAL | Status: DC | PRN

## 2015-12-05 NOTE — ED Provider Notes (Signed)
[facility] Emergency Department Provider Note  Time seen: 12:31 PM  I have reviewed the triage vital signs and the nursing notes.   HISTORY  Chief Complaint Nausea and Diarrhea    HPI Lewie LoronBrittany Corker is a 25 y.o. female with a past medical history of bipolar disorder, asthma presents the emergency department with nausea and diarrhea. According to the patient since last night she has had 5 episodes of diarrhea, been nauseated but has not vomited. Denies any abdominal pain or fever. 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 Patient denies any pain, vomiting, fever. States she is required to get a work note.Denies any black or bloody stool.     Past Medical History  Diagnosis Date  . Asthma   . Bipolar disorder (HCC)     Pt is supposed to be taking medications and is not.     There are no active problems to display for this patient.   Past Surgical History  Procedure Laterality Date  . Tonsillectomy    . Tympanostomy tube placement      Current Outpatient Rx  Name  Route  Sig  Dispense  Refill  . cyclobenzaprine (FLEXERIL) 5 MG tablet   Oral   Take 1 tablet (5 mg total) by mouth every 8 (eight) hours as needed for muscle spasms.   30 tablet   1   . predniSONE (DELTASONE) 10 MG tablet   Oral   Take 1 tablet (10 mg total) by mouth daily. 6,5,4,3,2,1 six day taper   21 tablet   0   . traMADol (ULTRAM) 50 MG tablet   Oral   Take 50 mg by mouth every 6 (six) hours as needed. For pain         . valACYclovir (VALTREX) 1000 MG tablet   Oral   Take 1 tablet (1,000 mg total) by mouth 3 (three) times daily.   21 tablet   0     Allergies Ibuprofen; Latex; and Other  No family history on file.  Social History Social History  Substance Use Topics  . Smoking status: Current Every Day Smoker    Types: Cigarettes  . Smokeless  tobacco: None  . Alcohol Use: Yes     Comment: occasionally    Review of Systems Constitutional: Negative for fever. Cardiovascular: Negative for chest pain. Respiratory: Negative for shortness of breath. Gastrointestinal: Negative for abdominal pain. Positive for nausea. Negative for vomiting. Positive for diarrhea 5 episodes. Genitourinary: Negative for dysuria Neurological: Negative for headache 10-point ROS otherwise negative.  ____________________________________________   PHYSICAL EXAM:  VITAL SIGNS: ED Triage Vitals  Enc Vitals Group     BP 12/05/15 1211 138/70 mmHg     Pulse Rate 12/05/15 1211 86     Resp 12/05/15 1211 18     Temp 12/05/15 1211 97.7 F (36.5 C)     Temp Source 12/05/15 1211 Oral     SpO2 12/05/15 1211 100 %     Weight 12/05/15 1211 315 lb (142.883 kg)     Height 12/05/15 1211 5\' 6"  (1.676 m)     Head Cir --      Peak Flow --      Pain Score 12/05/15 1211 5     Pain Loc --      Pain Edu? --      Excl. in GC? --  Constitutional: Alert and oriented. Well appearing and in no distress. Eyes: Normal exam ENT   Head: Normocephalic and atraumatic   Mouth/Throat: Mucous membranes are moist. Cardiovascular: Normal rate, regular rhythm. No murmur Respiratory: Normal respiratory effort without tachypnea nor retractions. Breath sounds are clear  Gastrointestinal: Soft and nontender. No distention.  Musculoskeletal: Nontender with normal range of motion in all extremities. Neurologic:  Normal speech and language. No gross focal neurologic deficits  Skin:  Skin is warm, dry and intact.  Psychiatric: Mood and affect are normal.   ____________________________________________   INITIAL IMPRESSION / ASSESSMENT AND PLAN / ED COURSE  Pertinent labs & imaging results that were available during my care of the patient were reviewed by me and considered in my medical decision making (see chart for details).  The patient presents to the emergency  department with nausea since last night, denies any vomiting. Patient has had 5 episodes of diarrhea since last night, denies any black or bloody stool. Denies any abdominal pain. Denies any fever. Patient has a nontender exam, and appears well with moist mucous membranes, no clinical signs of dehydration. I discussed the options with the patient as far as checking lab work and IV hydration, the patient states she would prefer a trial of loperamide at home and she will return to the emergency department if symptoms worsen. 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 At this time I do not feel blood work would be of much utility as the patient clinically appears well. We will discharge on loperamide, I discussed plenty of oral hydration as well as strict return precautions, the patient is agreeable.  ____________________________________________   FINAL CLINICAL IMPRESSION(S) / ED DIAGNOSES  Nausea Diarrhea   Minna Antis, MD 12/05/15 1234

## 2015-12-05 NOTE — ED Notes (Signed)
Pt presents to ED with reports of nausea and diarrhea since last night. Pt states she works in [employer]. Pt reports headache. Pt in no apparent distress in triage.

## 2015-12-05 NOTE — Discharge Instructions (Signed)
Diarrhea Diarrhea is watery poop (stool). It can make you feel weak, tired, thirsty, or give you a dry mouth (signs of dehydration). Watery poop is a sign of another problem, most often an infection. It often lasts 2-3 days. It can last longer if it is a sign of something serious. Take care of yourself as told by your doctor. HOME CARE   Drink 1 cup (8 ounces) of fluid each time you have watery poop.  Do not drink the following fluids:  Those that contain simple sugars (fructose, glucose, galactose, lactose, sucrose, maltose).  Sports drinks.  Fruit juices.  Whole milk products.  Sodas.  Drinks with caffeine (coffee, tea, soda) or alcohol.  Oral rehydration solution may be used if the doctor says it is okay. You may make your own solution. Follow this recipe:   - teaspoon table salt.   teaspoon baking soda.   teaspoon salt substitute containing potassium chloride.  1 tablespoons sugar.  1 liter (34 ounces) of water.  Avoid the following foods:  High fiber foods, such as raw fruits and vegetables.  Nuts, seeds, and whole grain breads and cereals.   Those that are sweetened with sugar alcohols (xylitol, sorbitol, mannitol).  Try eating the following foods:  Starchy foods, such as rice, toast, pasta, low-sugar cereal, oatmeal, baked potatoes, crackers, and bagels.  Bananas.  Applesauce.  Eat probiotic-rich foods, such as yogurt and milk products that are fermented.  Wash your hands well after each time you have watery poop.  Only take medicine as told by your doctor.  Take a warm bath to help lessen burning or pain from having watery poop. GET HELP RIGHT AWAY IF:   You cannot drink fluids without throwing up (vomiting).  You keep throwing up.  You have blood in your poop, or your poop looks black and tarry.  You do not pee (urinate) in 6-8 hours, or there is only a small amount of very dark pee.  You have belly (abdominal) pain that gets worse or stays  in the same spot (localizes).  You are weak, dizzy, confused, or light-headed.  You have a very bad headache.  Your watery poop gets worse or does not get better.  You have a fever or lasting symptoms for more than 2-3 days.  You have a fever and your symptoms suddenly get worse. MAKE SURE YOU:   Understand these instructions.  Will watch your condition.  Will get help right away if you are not doing well or get worse.   This information is not intended to replace advice given to you by your health care provider. Make sure you discuss any questions you have with your health care provider.   Document Released: 12/01/2007 Document Revised: 07/05/2014 Document Reviewed: 02/20/2012 Elsevier Interactive Patient Education 2016 [employer]  Nausea, Adult Nausea is the feeling that you have an upset stomach or have to vomit. Nausea by itself is not likely a serious concern, but it may be an early sign of more serious medical problems. As nausea gets worse, it can lead to vomiting. If vomiting develops, there is the risk of dehydration.  CAUSES   Viral infections.  Food poisoning.  Medicines.  Pregnancy.  Motion sickness.  Migraine headaches.  Emotional distress.  Severe pain from any source.  Alcohol intoxication. HOME CARE INSTRUCTIONS  Get plenty of rest.  Ask your caregiver about specific rehydration instructions.  Eat small amounts of food and sip liquids more often.  Take all medicines as told  by your caregiver. SEEK MEDICAL CARE IF:  You have not improved after 2 days, or you get worse.  You have a headache. SEEK IMMEDIATE MEDICAL CARE IF:   You have a fever.  You faint.  You keep vomiting or have blood in your vomit.  You are extremely weak or dehydrated.  You have dark or bloody stools.  You have severe chest or abdominal pain. MAKE SURE YOU:  Understand these instructions.  Will watch your condition.  Will get help right away if you  are not doing well or get worse.   This information is not intended to replace advice given to you by your health care provider. Make sure you discuss any questions you have with your health care provider.   Document Released: 07/22/2004 Document Revised: 07/05/2014 Document Reviewed: 02/24/2011 Elsevier Interactive Patient Education [employer]

## 2016-01-28 ENCOUNTER — Encounter: Payer: Self-pay | Admitting: *Deleted

## 2016-01-28 ENCOUNTER — Emergency Department: Payer: Medicaid Other

## 2016-01-28 ENCOUNTER — Emergency Department
Admission: EM | Admit: 2016-01-28 | Discharge: 2016-01-28 | Disposition: A | Discharge status: Home / Self Care | Payer: Medicaid Other | Attending: Emergency Medicine | Admitting: Emergency Medicine

## 2016-01-28 DIAGNOSIS — F17210 Nicotine dependence, cigarettes, uncomplicated: Secondary | ICD-10-CM | POA: Diagnosis not present

## 2016-01-28 DIAGNOSIS — Z3A01 Less than 8 weeks gestation of pregnancy: Secondary | ICD-10-CM | POA: Insufficient documentation

## 2016-01-28 DIAGNOSIS — Z79899 Other long term (current) drug therapy: Secondary | ICD-10-CM | POA: Insufficient documentation

## 2016-01-28 DIAGNOSIS — O219 Vomiting of pregnancy, unspecified: Secondary | ICD-10-CM | POA: Diagnosis not present

## 2016-01-28 DIAGNOSIS — Z3491 Encounter for supervision of normal pregnancy, unspecified, first trimester: Secondary | ICD-10-CM

## 2016-01-28 DIAGNOSIS — O99331 Smoking (tobacco) complicating pregnancy, first trimester: Secondary | ICD-10-CM | POA: Diagnosis not present

## 2016-01-28 DIAGNOSIS — F319 Bipolar disorder, unspecified: Secondary | ICD-10-CM | POA: Insufficient documentation

## 2016-01-28 DIAGNOSIS — R112 Nausea with vomiting, unspecified: Secondary | ICD-10-CM

## 2016-01-28 LAB — COMPREHENSIVE METABOLIC PANEL
ALBUMIN: 3.9 g/dL (ref 3.5–5.0)
ALT: 13 U/L — AB (ref 14–54)
AST: 14 U/L — AB (ref 15–41)
Alkaline Phosphatase: 71 U/L (ref 38–126)
Anion gap: 12 (ref 5–15)
BUN: 8 mg/dL (ref 6–20)
CHLORIDE: 104 mmol/L (ref 101–111)
CO2: 20 mmol/L — ABNORMAL LOW (ref 22–32)
Calcium: 9.2 mg/dL (ref 8.9–10.3)
Creatinine, Ser: 0.44 mg/dL (ref 0.44–1.00)
GFR calc Af Amer: >60 mL/min (ref >60)
GFR calc non Af Amer: >60 mL/min (ref >60)
GLUCOSE: 97 mg/dL (ref 65–99)
POTASSIUM: 3.8 mmol/L (ref 3.5–5.1)
Sodium: 136 mmol/L (ref 135–145)
Total Bilirubin: 0.6 mg/dL (ref 0.3–1.2)
Total Protein: 7.4 g/dL (ref 6.5–8.1)

## 2016-01-28 LAB — CBC WITH DIFFERENTIAL/PLATELET
BASOS ABS: 0.1 10*3/uL (ref 0–0.1)
BASOS PCT: 1 %
EOS PCT: 0 %
Eosinophils Absolute: 0 10*3/uL (ref 0–0.7)
HEMATOCRIT: 38.1 % (ref 35.0–47.0)
Hemoglobin: 12.9 g/dL (ref 12.0–16.0)
Lymphocytes Relative: 19 %
Lymphs Abs: 3 10*3/uL (ref 1.0–3.6)
MCH: 26.4 pg (ref 26.0–34.0)
MCHC: 33.8 g/dL (ref 32.0–36.0)
MCV: 78 fL — ABNORMAL LOW (ref 80.0–100.0)
MONO ABS: 0.8 10*3/uL (ref 0.2–0.9)
Monocytes Relative: 5 %
NEUTROS ABS: 11.5 10*3/uL — AB (ref 1.4–6.5)
Neutrophils Relative %: 75 %
PLATELETS: 386 10*3/uL (ref 150–440)
RBC: 4.88 MIL/uL (ref 3.80–5.20)
RDW: 15.8 % — AB (ref 11.5–14.5)
WBC: 15.4 10*3/uL — AB (ref 3.6–11.0)

## 2016-01-28 LAB — HCG, QUANTITATIVE, PREGNANCY: HCG, BETA CHAIN, QUANT, S: 83648 m[IU]/mL — AB (ref <5)

## 2016-01-28 MED ORDER — PROMETHAZINE HCL 25 MG PO TABS: 25.0000 mg | ORAL_TABLET | ORAL | 1 refills | Status: DC | PRN

## 2016-01-28 NOTE — ED Notes (Signed)
Unable to doppler FHT after multiple tries - EDP aware.

## 2016-01-28 NOTE — ED Triage Notes (Signed)
States she wants an [place] to know how far along she is, states 2 positive pregnancy tests and was told she was possibly 21 weeks by health dept, states nausea as well

## 2016-01-28 NOTE — ED Provider Notes (Signed)
[facility] Emergency Department Provider Note        Time seen: ----------------------------------------- 3:27 PM on 01/28/2016 -----------------------------------------    I have reviewed the triage vital signs and the nursing notes.   HISTORY  Chief Complaint Nausea    HPI Crystal House is a 25 y.o. female who presents to ER to find out how far along she has some terms of pregnancy. Patient went to the health Department earlier today and was sent here for further evaluation. She's not had any fevers, chills, chest pain, shortness of breath, or diarrhea. Patient has had persistent nausea and vomiting. She is not sure when her last normal menstrual cycle was. She has not had any vaginal bleeding or leakage of fluid.   Past Medical History:  Diagnosis Date  . Asthma   . Bipolar disorder (HCC)    Pt is supposed to be taking medications and is not.     There are no active problems to display for this patient.   Past Surgical History:  Procedure Laterality Date  . TONSILLECTOMY    . TYMPANOSTOMY TUBE PLACEMENT      Allergies Ibuprofen; Latex; and Other  Social History Social History  Substance Use Topics  . Smoking status: Current Every Day Smoker    Types: Cigarettes  . Smokeless tobacco: Not on file  . Alcohol use Yes     Comment: occasionally    Review of Systems Constitutional: Negative for fever. Cardiovascular: Negative for chest pain. Respiratory: Negative for shortness of breath. Gastrointestinal: Negative for abdominal pain, Positive for vomiting Genitourinary: Negative for dysuria. Musculoskeletal: Negative for back pain. Skin: Negative for rash. Neurological: Negative for headaches, focal weakness or numbness.  10-point ROS otherwise negative.  ____________________________________________   PHYSICAL EXAM:  VITAL SIGNS: ED Triage Vitals  Enc Vitals Group     BP 01/28/16 1510 139/77     Pulse Rate 01/28/16 1510 97      Resp 01/28/16 1510 18     Temp 01/28/16 1510 98.5 F (36.9 C)     Temp Source 01/28/16 1510 Oral     SpO2 01/28/16 1510 98 %     Weight 01/28/16 1510 (!) 316 lb (143.3 kg)     Height 01/28/16 1510  (1.651 m)     Head Circumference --      Peak Flow --      Pain Score 01/28/16 1513 3     Pain Loc --      Pain Edu? --      Excl. in GC? --     Constitutional: Alert and oriented. Well appearing and in no distress.Obese Eyes: Conjunctivae are normal. PERRL. Normal extraocular movements. ENT   Head: Normocephalic and atraumatic.   Nose: No congestion/rhinnorhea.   Mouth/Throat: Mucous membranes are moist.   Neck: No stridor. Cardiovascular: Normal rate, regular rhythm. No murmurs, rubs, or gallops. Respiratory: Normal respiratory effort without tachypnea nor retractions. Breath sounds are clear and equal bilaterally. No wheezes/rales/rhonchi. Gastrointestinal: Soft and nontender. Normal bowel sounds Musculoskeletal: Nontender with normal range of motion in all extremities. No lower extremity tenderness nor edema. Neurologic:  Normal speech and language. No gross focal neurologic deficits are appreciated.  Skin:  Skin is warm, dry and intact. No rash noted. Psychiatric: Mood and affect are normal. Speech and behavior are normal.  ____________________________________________  ED COURSE:  Pertinent labs & imaging results that were available during my care of the patient were reviewed by me and considered in my medical  decision making (see chart for details). Clinical Course  Patient is in no acute distress, we will check basic labs and assess with fetal heart tones.  Procedures ____________________________________________   LABS (pertinent positives/negatives)  Labs Reviewed  HCG, QUANTITATIVE, PREGNANCY - Abnormal; Notable for the following:       Result Value   hCG, Beta Chain, Quant, [place] 61,950 (*)    All other components within normal limits  CBC WITH  DIFFERENTIAL/PLATELET - Abnormal; Notable for the following:    WBC 15.4 (*)    MCV 78.0 (*)    RDW 15.8 (*)    Neutro Abs 11.5 (*)    All other components within normal limits  COMPREHENSIVE METABOLIC PANEL - Abnormal; Notable for the following:    CO2 20 (*)    AST 14 (*)    ALT 13 (*)    All other components within normal limits  POC URINE PREG, ED  ABO/RH   ____________________________________________  FINAL ASSESSMENT AND PLAN  First trimester pregnancy, nausea and vomiting  Plan: Patient with labs as dictated above. Patient is in no acute distress, has evidence of a first trimester pregnancy. I will advise prenatal vitamins and referred to OB/GYN for outpatient follow-up.   Emily Filbert, MD   Note: This dictation was prepared with Dragon dictation. Any transcriptional errors that result from this process are unintentional    Emily Filbert, MD 01/28/16 [phone]

## 2016-01-29 LAB — ABO/RH: ABO/RH(D): O POS

## 2016-02-11 LAB — HM PAP SMEAR: HM PAP: NEGATIVE

## 2016-04-04 ENCOUNTER — Emergency Department
Admission: EM | Admit: 2016-04-04 | Discharge: 2016-04-04 | Disposition: A | Discharge status: Home / Self Care | Payer: Medicaid Other | Attending: Student | Admitting: Student

## 2016-04-04 ENCOUNTER — Encounter: Payer: Self-pay | Admitting: Emergency Medicine

## 2016-04-04 DIAGNOSIS — J45909 Unspecified asthma, uncomplicated: Secondary | ICD-10-CM | POA: Insufficient documentation

## 2016-04-04 DIAGNOSIS — R109 Unspecified abdominal pain: Secondary | ICD-10-CM

## 2016-04-04 DIAGNOSIS — Z91040 Latex allergy status: Secondary | ICD-10-CM | POA: Insufficient documentation

## 2016-04-04 DIAGNOSIS — R1030 Lower abdominal pain, unspecified: Secondary | ICD-10-CM | POA: Diagnosis present

## 2016-04-04 DIAGNOSIS — N898 Other specified noninflammatory disorders of vagina: Secondary | ICD-10-CM | POA: Diagnosis not present

## 2016-04-04 DIAGNOSIS — O26892 Other specified pregnancy related conditions, second trimester: Secondary | ICD-10-CM | POA: Diagnosis not present

## 2016-04-04 DIAGNOSIS — Z3A17 17 weeks gestation of pregnancy: Secondary | ICD-10-CM | POA: Diagnosis not present

## 2016-04-04 DIAGNOSIS — Z87891 Personal history of nicotine dependence: Secondary | ICD-10-CM | POA: Diagnosis not present

## 2016-04-04 DIAGNOSIS — Z79899 Other long term (current) drug therapy: Secondary | ICD-10-CM | POA: Diagnosis not present

## 2016-04-04 LAB — CBC
HEMATOCRIT: 37.4 % (ref 35.0–47.0)
HEMOGLOBIN: 12.3 g/dL (ref 12.0–16.0)
MCH: 26.3 pg (ref 26.0–34.0)
MCHC: 32.8 g/dL (ref 32.0–36.0)
MCV: 80.2 fL (ref 80.0–100.0)
PLATELETS: 395 10*3/uL (ref 150–440)
RBC: 4.67 MIL/uL (ref 3.80–5.20)
RDW: 14.6 % — ABNORMAL HIGH (ref 11.5–14.5)
WBC: 16.6 10*3/uL — AB (ref 3.6–11.0)

## 2016-04-04 LAB — URINALYSIS COMPLETE WITH MICROSCOPIC (ARMC ONLY)
BACTERIA UA: NONE SEEN
BILIRUBIN URINE: NEGATIVE
Glucose, UA: NEGATIVE mg/dL
HGB URINE DIPSTICK: NEGATIVE
Ketones, ur: NEGATIVE mg/dL
LEUKOCYTES UA: NEGATIVE
Nitrite: NEGATIVE
PH: 5 (ref 5.0–8.0)
PROTEIN: NEGATIVE mg/dL
Specific Gravity, Urine: 1.029 (ref 1.005–1.030)

## 2016-04-04 LAB — COMPREHENSIVE METABOLIC PANEL
ALT: 9 U/L — AB (ref 14–54)
AST: 15 U/L (ref 15–41)
Albumin: 3.4 g/dL — ABNORMAL LOW (ref 3.5–5.0)
Alkaline Phosphatase: 70 U/L (ref 38–126)
Anion gap: 7 (ref 5–15)
BUN: 11 mg/dL (ref 6–20)
CHLORIDE: 105 mmol/L (ref 101–111)
CO2: 23 mmol/L (ref 22–32)
CREATININE: 0.61 mg/dL (ref 0.44–1.00)
Calcium: 9.3 mg/dL (ref 8.9–10.3)
Glucose, Bld: 89 mg/dL (ref 65–99)
POTASSIUM: 3.8 mmol/L (ref 3.5–5.1)
SODIUM: 135 mmol/L (ref 135–145)
Total Bilirubin: 0.2 mg/dL — ABNORMAL LOW (ref 0.3–1.2)
Total Protein: 7.4 g/dL (ref 6.5–8.1)

## 2016-04-04 LAB — WET PREP, GENITAL
Clue Cells Wet Prep HPF POC: NONE SEEN
Sperm: NONE SEEN
TRICH WET PREP: NONE SEEN
YEAST WET PREP: NONE SEEN

## 2016-04-04 LAB — HCG, QUANTITATIVE, PREGNANCY: hCG, Beta Chain, Quant, S: 18807 m[IU]/mL — ABNORMAL HIGH (ref <5)

## 2016-04-04 LAB — LIPASE, BLOOD: LIPASE: 18 U/L (ref 11–51)

## 2016-04-04 NOTE — ED Triage Notes (Signed)
Pt states she started having stabbing pains and cramping in abdomen worse on the left side. Pt states her last delivery she was 1 month and did not have contractions. Pt states she went to the bathroom today and when she wiped it was light pink and has been having discharge.  Pt is 17 weeks and 3D pregnant. [facility]. Pt states last pregnancy she bled a lot during the 2nd trimester.

## 2016-04-04 NOTE — ED Notes (Signed)
Unable to obtain fht. Attempted with fetal doppler, am able to faintly pick up pt's hr, but not able to assess fetal. Ultrasound machine placed in room for dr. Inocencio HomesGayle per request after notifying md of difficulty obtaining fht.

## 2016-04-04 NOTE — ED Provider Notes (Signed)
[facility] Emergency Department Provider Note   ____________________________________________   First MD Initiated Contact with Patient 04/04/16 1914     (approximate)  I have reviewed the triage vital signs and the nursing notes.   HISTORY  Chief Complaint Abdominal Pain (Pregnant) and Vaginal Discharge (Pregnant)    HPI Crystal House is a 25 y.o. female G2 P1 at 17 weeks and 3 days estimated gestational age by ultrasound who presents for evaluation of lower abdominal pain, cramping "pressure" today, gradual onset, constant, mild, no modifying factors. Patient reports she feels the pain in her left lower abdomen. She also reports that when she wiped today she saw some pink discharge but denies any frank blood. She denies any nausea, vomiting, diarrhea, fevers or chills or she is very nervous about the pink discharge because she had bleeding all throughout the second and third trimester in her previous pregnancy. She seeks care at the health department. She is in a mutually monogamous sexual relationship with her husband and denies concern for sexual transmitted infection.   Past Medical History:  Diagnosis Date  . Asthma   . Bipolar disorder (HCC)    Pt is supposed to be taking medications and is not.     There are no active problems to display for this patient.   Past Surgical History:  Procedure Laterality Date  . CESAREAN SECTION     x1  . TONSILLECTOMY    . TYMPANOSTOMY TUBE PLACEMENT      Prior to Admission medications   Medication Sig Start Date End Date Taking? Authorizing Provider  cyclobenzaprine (FLEXERIL) 5 MG tablet Take 1 tablet (5 mg total) by mouth every 8 (eight) hours as needed for muscle spasms. 06/17/15   Evon Slack, PA-C  loperamide (IMODIUM A-D) 2 MG tablet Take 1 tablet (2 mg total) by mouth 4 (four) times daily as needed for diarrhea or loose stools. 12/05/15   Minna Antis, MD  ondansetron (ZOFRAN ODT) 4 MG  disintegrating tablet Take 1 tablet (4 mg total) by mouth every 8 (eight) hours as needed for nausea or vomiting. 12/05/15   Minna Antis, MD  predniSONE (DELTASONE) 10 MG tablet Take 1 tablet (10 mg total) by mouth daily. 6,5,4,3,2,1 six day taper 06/17/15   Evon Slack, PA-C  promethazine (PHENERGAN) 25 MG tablet Take 1 tablet (25 mg total) by mouth every 4 (four) hours as needed for nausea or vomiting. 01/28/16   Emily Filbert, MD  traMADol (ULTRAM) 50 MG tablet Take 50 mg by mouth every 6 (six) hours as needed. For pain    Historical Provider, MD  valACYclovir (VALTREX) 1000 MG tablet Take 1 tablet (1,000 mg total) by mouth 3 (three) times daily. 07/15/15   Irean Hong, MD    Allergies Ibuprofen; Latex; and Other  History reviewed. No pertinent family history.  Social History Social History  Substance Use Topics  . Smoking status: Former Smoker    Types: Cigarettes  . Smokeless tobacco: Never Used  . Alcohol use Yes     Comment: occasionally    Review of Systems Constitutional: No fever/chills Eyes: No visual changes. ENT: No sore throat. Cardiovascular: Denies chest pain. Respiratory: Denies shortness of breath. Gastrointestinal: + abdominal pain.  No nausea, no vomiting.  No diarrhea.  No constipation. Genitourinary: Negative for dysuria. Musculoskeletal: Negative for back pain. Skin: Negative for rash. Neurological: Negative for headaches, focal weakness or numbness.  10-point ROS otherwise negative.  ____________________________________________   PHYSICAL EXAM:  VITAL SIGNS: ED Triage Vitals  Enc Vitals Group     BP 04/04/16 1855 (!) 168/85     Pulse Rate 04/04/16 1855 95     Resp 04/04/16 1855 18     Temp 04/04/16 1855 98.2 F (36.8 C)     Temp Source 04/04/16 1855 Oral     SpO2 04/04/16 1855 100 %     Weight 04/04/16 1855 (!) 325 lb (147.4 kg)     Height 04/04/16 1855 5\' 5"  (1.651 m)     Head Circumference --      Peak Flow --      Pain Score  04/04/16 1854 5     Pain Loc --      Pain Edu? --      Excl. in GC? --     Constitutional: Alert and oriented. Well appearing and in no acute distress. Sitting up in bed texing on her smart phone. Morbidly obese body habitus. Eyes: Conjunctivae are normal. PERRL. EOMI. Head: Atraumatic. Nose: No congestion/rhinnorhea. Mouth/Throat: Mucous membranes are moist.  Oropharynx non-erythematous. Neck: No stridor.  Supple Without meningismus. Cardiovascular: Normal rate, regular rhythm. Grossly normal heart sounds.  Good peripheral circulation. Respiratory: Normal respiratory effort.  No retractions. Lungs CTAB. Gastrointestinal: Soft and nontender. No distention. Obesity slightly limited exam. No CVA tenderness. Pelvic: closed os, no blood, small amount of thin white non-malodorous vaginal discharge in the vault, no abnormal motion or bimanual tenderness. Musculoskeletal: No lower extremity tenderness nor edema.  No joint effusions. Neurologic:  Normal speech and language. No gross focal neurologic deficits are appreciated. No gait instability. Skin:  Skin is warm, dry and intact. No rash noted. Psychiatric: Mood and affect are normal. Speech and behavior are normal.  ____________________________________________   LABS (all labs ordered are listed, but only abnormal results are displayed)  Labs Reviewed  WET PREP, GENITAL - Abnormal; Notable for the following:       Result Value   WBC, Wet Prep HPF POC FEW (*)    All other components within normal limits  COMPREHENSIVE METABOLIC PANEL - Abnormal; Notable for the following:    Albumin 3.4 (*)    ALT 9 (*)    Total Bilirubin 0.2 (*)    All other components within normal limits  CBC - Abnormal; Notable for the following:    WBC 16.6 (*)    RDW 14.6 (*)    All other components within normal limits  URINALYSIS COMPLETEWITH MICROSCOPIC ([facility] ONLY) - Abnormal; Notable for the following:    Color, Urine YELLOW (*)    APPearance CLEAR (*)     Squamous Epithelial / LPF 0-5 (*)    All other components within normal limits  HCG, QUANTITATIVE, PREGNANCY - Abnormal; Notable for the following:    hCG, Beta Chain, Quant, S 18,807 (*)    All other components within normal limits  LIPASE, BLOOD   ____________________________________________  EKG  none ____________________________________________  RADIOLOGY  Limited transabdominal bedside emergency department ultrasound performed by me shows a single live intrauterine pregnancy with good fetal movement, fetal heart rate 143 bpm. ____________________________________________   PROCEDURES  Procedure(s) performed: None  Procedures  Critical Care performed: No  ____________________________________________   INITIAL IMPRESSION / ASSESSMENT AND PLAN / ED COURSE  Pertinent labs & imaging results that were available during my care of the patient were reviewed by me and considered in my medical decision making (see chart for details).  Crystal House is a 25 y.o. female G2 P1 at 64  weeks and 3 days estimated gestational age by ultrasound who presents for evaluation of lower abdominal pain, cramping "pressure" today, gradual onset, constant, mild, no modifying factors. On exam, she is very well-appearing and in no acute distress. Her vital signs are stable and she is afebrile. She has a benign soft abdomen, reassuring pelvic exam. Fetal heart rate is reassuring at 143 bpm. I discussed with her that her left-sided pain could represent round ligament pain. I doubt tubo-ovarian abscess or torsion given her very well appearance. Plan for screening labs and urinalysis. O+ blood type on chart review.  ----------------------------------------- 8:45 PM on 04/04/2016 ----------------------------------------- Patient continues to appear well. CBC shows a chronic leukocytosis. Unremarkable CMP and lipase. Urinalysis is not consistent with infection. Wet prep negative. HCG is elevated at  greater than 18,000. I suspect the initial blood pressure taken at triage was erroneous as it was measured with a small blood pressure cuff and this patient has a large arm and is obese. Repeat blood pressure at the time of discharge with large cuff is 131/83, within normal limits. No evidence to suggest HELLP syndrome. Discussed return precautions and need for close OB/GYN follow-up and the patient is comfortable with the discharge plan. DC home.  Clinical Course     ____________________________________________   FINAL CLINICAL IMPRESSION(S) / ED DIAGNOSES  Final diagnoses:  Abdominal pain during pregnancy in second trimester      NEW MEDICATIONS STARTED DURING THIS VISIT:  New Prescriptions   No medications on file     Note:  This document was prepared using Dragon voice recognition software and may include unintentional dictation errors.    Gayla DossEryka A Ringo Sherod, MD 04/04/16 2046

## 2017-03-02 ENCOUNTER — Emergency Department
Admission: EM | Admit: 2017-03-02 | Discharge: 2017-03-02 | Disposition: A | Discharge status: Home / Self Care | Payer: Self-pay | Attending: Emergency Medicine | Admitting: Emergency Medicine

## 2017-03-02 DIAGNOSIS — N760 Acute vaginitis: Secondary | ICD-10-CM | POA: Insufficient documentation

## 2017-03-02 DIAGNOSIS — Z91040 Latex allergy status: Secondary | ICD-10-CM | POA: Insufficient documentation

## 2017-03-02 DIAGNOSIS — J45909 Unspecified asthma, uncomplicated: Secondary | ICD-10-CM | POA: Insufficient documentation

## 2017-03-02 DIAGNOSIS — Z87891 Personal history of nicotine dependence: Secondary | ICD-10-CM | POA: Insufficient documentation

## 2017-03-02 DIAGNOSIS — Z79899 Other long term (current) drug therapy: Secondary | ICD-10-CM | POA: Insufficient documentation

## 2017-03-02 LAB — URINALYSIS, COMPLETE (UACMP) WITH MICROSCOPIC
BILIRUBIN URINE: NEGATIVE
Bacteria, UA: NONE SEEN
Glucose, UA: NEGATIVE mg/dL
HGB URINE DIPSTICK: NEGATIVE
Ketones, ur: NEGATIVE mg/dL
LEUKOCYTES UA: NEGATIVE
Nitrite: NEGATIVE
Protein, ur: NEGATIVE mg/dL
RBC / HPF: NONE SEEN RBC/hpf (ref 0–5)
SPECIFIC GRAVITY, URINE: 1.03 (ref 1.005–1.030)
pH: 5 (ref 5.0–8.0)

## 2017-03-02 LAB — WET PREP, GENITAL
Clue Cells Wet Prep HPF POC: NONE SEEN
SPERM: NONE SEEN
TRICH WET PREP: NONE SEEN
YEAST WET PREP: NONE SEEN

## 2017-03-02 LAB — CHLAMYDIA/NGC RT PCR (ARMC ONLY)
Chlamydia Tr: NOT DETECTED
N gonorrhoeae: NOT DETECTED

## 2017-03-02 LAB — POC URINE PREG, ED: PREG TEST UR: NEGATIVE

## 2017-03-02 MED ORDER — METRONIDAZOLE 500 MG PO TABS: 500.0000 mg | ORAL_TABLET | Freq: Two times a day (BID) | ORAL | 0 refills | Status: AC

## 2017-03-02 NOTE — ED Provider Notes (Signed)
[facility] Emergency Department Provider Note  ____________________________________________  Time seen: Approximately 1:51 PM  I have reviewed the triage vital signs and the nursing notes.   HISTORY  Chief Complaint Vaginal Discharge    HPI Crystal House is a 26 y.o. female that presents to the emergency department for evaluation of "vaginal irritation, swelling, and discharge" for 1 week.Discharge is white. She had yeast infection 6 months ago when she had her baby. She is not breast-feeding. She has only had 2 menstrual periods since having her baby. Her last menstrual period was 3 weeks ago and was much lighter and shorter than normal. She was seen at her PCP 1 week ago and was tested for yeast, gonorrhea, chlamydia and tested negative. Her husband has herpes but she has not noticed any lesions. No fever, back pain, abdominal pain, dysuria, urgency, frequency, hematuria.   Past Medical History:  Diagnosis Date  . Asthma   . Bipolar disorder (HCC)    Pt is supposed to be taking medications and is not.     There are no active problems to display for this patient.   Past Surgical History:  Procedure Laterality Date  . CESAREAN SECTION     x1  . TONSILLECTOMY    . TYMPANOSTOMY TUBE PLACEMENT      Prior to Admission medications   Medication Sig Start Date End Date Taking? Authorizing Provider  cyclobenzaprine (FLEXERIL) 5 MG tablet Take 1 tablet (5 mg total) by mouth every 8 (eight) hours as needed for muscle spasms. 06/17/15   Evon SlackGaines, Thomas C, PA-C  loperamide (IMODIUM A-D) 2 MG tablet Take 1 tablet (2 mg total) by mouth 4 (four) times daily as needed for diarrhea or loose stools. 12/05/15   Minna AntisPaduchowski, Kevin, MD  metroNIDAZOLE (FLAGYL) 500 MG tablet Take 1 tablet (500 mg total) by mouth 2 (two) times daily. 03/02/17 03/12/17  Enid DerryWagner, Ancel Easler, PA-C  ondansetron (ZOFRAN ODT) 4 MG disintegrating tablet Take 1 tablet (4 mg total) by mouth every 8 (eight)  hours as needed for nausea or vomiting. 12/05/15   Minna AntisPaduchowski, Kevin, MD  predniSONE (DELTASONE) 10 MG tablet Take 1 tablet (10 mg total) by mouth daily. 6,5,4,3,2,1 six day taper 06/17/15   Evon SlackGaines, Thomas C, PA-C  promethazine (PHENERGAN) 25 MG tablet Take 1 tablet (25 mg total) by mouth every 4 (four) hours as needed for nausea or vomiting. 01/28/16   Emily FilbertWilliams, Jonathan E, MD  traMADol (ULTRAM) 50 MG tablet Take 50 mg by mouth every 6 (six) hours as needed. For pain    [provider]  valACYclovir (VALTREX) 1000 MG tablet Take 1 tablet (1,000 mg total) by mouth 3 (three) times daily. 07/15/15   Irean HongSung, Jade J, MD    Allergies Ibuprofen; Latex; and Other  No family history on file.  Social History Social History  Substance Use Topics  . Smoking status: Former Smoker    Types: Cigarettes  . Smokeless tobacco: Never Used  . Alcohol use Yes     Comment: occasionally     Review of Systems  Constitutional: No fever/chills Cardiovascular: No chest pain. Respiratory: No SOB. Gastrointestinal: No abdominal pain.  No nausea, no vomiting.  Musculoskeletal: Negative for musculoskeletal pain. Skin: Negative for rash, abrasions, lacerations, ecchymosis.   ____________________________________________   PHYSICAL EXAM:  VITAL SIGNS: ED Triage Vitals  Enc Vitals Group     BP 03/02/17 1223 (!) 158/66     Pulse Rate 03/02/17 1223 94     Resp 03/02/17 1223  20     Temp 03/02/17 1223 98.7 F (37.1 C)     Temp Source 03/02/17 1223 Oral     SpO2 03/02/17 1223 99 %     Weight 03/02/17 1224 (!) 350 lb (158.8 kg)     Height 03/02/17 1224 5\' 6"  (1.676 m)     Head Circumference --      Peak Flow --      Pain Score 03/02/17 1225 0     Pain Loc --      Pain Edu? --      Excl. in GC? --      Constitutional: Alert and oriented. Well appearing and in no acute distress. Eyes: Conjunctivae are normal. PERRL. EOMI. Head: Atraumatic. ENT:      Ears:      Nose: No  congestion/rhinnorhea.      Mouth/Throat: Mucous membranes are moist.  Neck: No stridor.  Cardiovascular: Normal rate, regular rhythm.  Good peripheral circulation. Respiratory: Normal respiratory effort without tachypnea or retractions. Lungs CTAB. Good air entry to the bases with no decreased or absent breath sounds. Gastrointestinal: Bowel sounds 4 quadrants. Soft and nontender to palpation. No guarding or rigidity. No palpable masses. No distention.  Genitourinary: RN present for pelvic exam. No external rashes or lesions. Minimal white discharge present in vaginal canal. No cervical motion tenderness. Musculoskeletal: Full range of motion to all extremities. No gross deformities appreciated. Neurologic:  Normal speech and language. No gross focal neurologic deficits are appreciated.  Skin:  Skin is warm, dry and intact. No rash noted.   ____________________________________________   LABS (all labs ordered are listed, but only abnormal results are displayed)  Labs Reviewed  WET PREP, GENITAL - Abnormal; Notable for the following:       Result Value   WBC, Wet Prep HPF POC FEW (*)    All other components within normal limits  URINALYSIS, COMPLETE (UACMP) WITH MICROSCOPIC - Abnormal; Notable for the following:    Color, Urine YELLOW (*)    APPearance CLEAR (*)    Squamous Epithelial / LPF 0-5 (*)    All other components within normal limits  CHLAMYDIA/NGC RT PCR ([facility] ONLY)  POC URINE PREG, ED   ____________________________________________  EKG   ____________________________________________  RADIOLOGY  No results found.  ____________________________________________    PROCEDURES  Procedure(s) performed:    Procedures    Medications - No data to display   ____________________________________________   INITIAL IMPRESSION / ASSESSMENT AND PLAN / ED COURSE  Pertinent labs & imaging results that were available during my care of the patient were  reviewed by me and considered in my medical decision making (see chart for details).  Review of the [place] CSRS was performed in accordance of the [facility] prior to dispensing any controlled drugs.  Patient presented to the emergency department for evaluation of vaginal discharge. Vital signs and exam are reassuring. Wet prep negative for yeast, trichomoniasis, BV. Gonorrhea and chlamydia are negative. No signs of herpes infection. No signs of urinary tract infection on urinalysis. Wet prep shows white blood cells she will be treated for vaginitis. Patient will be discharged home with prescriptions for Flagyl. Patient is to follow up with gynecology as directed. Patient is given ED precautions to return to the ED for any worsening or new symptoms.     ____________________________________________  FINAL CLINICAL IMPRESSION(S) / ED DIAGNOSES  Final diagnoses:  Acute vaginitis      NEW MEDICATIONS STARTED DURING THIS VISIT:  Discharge Medication List  as of 03/02/2017  2:55 PM          This chart was dictated using voice recognition software/Dragon. Despite best efforts to proofread, errors can occur which can change the meaning. Any change was purely unintentional.    Enid Derry, PA-C 03/03/17 0748    Nita Sickle, MD 03/03/17 [phone]

## 2017-03-02 NOTE — ED Triage Notes (Signed)
Pt c/o red irritated labia for the past week, states she was seen by her PCP last week and was tested for STD that was negative and neg for yeast

## 2017-06-11 ENCOUNTER — Emergency Department
Admission: EM | Admit: 2017-06-11 | Discharge: 2017-06-11 | Disposition: A | Discharge status: Home / Self Care | Payer: Self-pay | Attending: Student in an Organized Health Care Education/Training Program | Admitting: Student in an Organized Health Care Education/Training Program

## 2017-06-11 ENCOUNTER — Encounter: Payer: Self-pay | Admitting: Emergency Medicine

## 2017-06-11 ENCOUNTER — Other Ambulatory Visit: Payer: Self-pay

## 2017-06-11 DIAGNOSIS — Z91040 Latex allergy status: Secondary | ICD-10-CM | POA: Insufficient documentation

## 2017-06-11 DIAGNOSIS — J45909 Unspecified asthma, uncomplicated: Secondary | ICD-10-CM | POA: Insufficient documentation

## 2017-06-11 DIAGNOSIS — Z87891 Personal history of nicotine dependence: Secondary | ICD-10-CM | POA: Insufficient documentation

## 2017-06-11 DIAGNOSIS — N949 Unspecified condition associated with female genital organs and menstrual cycle: Secondary | ICD-10-CM | POA: Insufficient documentation

## 2017-06-11 LAB — WET PREP, GENITAL
Clue Cells Wet Prep HPF POC: NONE SEEN
Sperm: NONE SEEN
TRICH WET PREP: NONE SEEN
Yeast Wet Prep HPF POC: NONE SEEN

## 2017-06-11 LAB — URINALYSIS, COMPLETE (UACMP) WITH MICROSCOPIC
BACTERIA UA: NONE SEEN
BILIRUBIN URINE: NEGATIVE
Glucose, UA: NEGATIVE mg/dL
Hgb urine dipstick: NEGATIVE
Ketones, ur: NEGATIVE mg/dL
LEUKOCYTES UA: NEGATIVE
Nitrite: NEGATIVE
PROTEIN: NEGATIVE mg/dL
Specific Gravity, Urine: 1.03 (ref 1.005–1.030)
pH: 5 (ref 5.0–8.0)

## 2017-06-11 LAB — CHLAMYDIA/NGC RT PCR (ARMC ONLY)
CHLAMYDIA TR: NOT DETECTED
N gonorrhoeae: NOT DETECTED

## 2017-06-11 NOTE — Discharge Instructions (Signed)
Follow-up with [facility] if any continued problems. You can [facility] if any continued problems. We will call with results  of your  of your remaining test  if they are positive.

## 2017-06-11 NOTE — ED Triage Notes (Signed)
Pt arrived via POV from home states she was exposed to Trichomonas.  States she knows she is having sxs has had it before.  Pt states she has had symptoms for about a week.

## 2017-06-11 NOTE — ED Provider Notes (Signed)
[facility] Emergency Department Provider Note   ____________________________________________   First MD Initiated Contact with Patient 06/11/17 [id number]     (approximate)  I have reviewed the triage vital signs and the nursing notes.   HISTORY  Chief Complaint Exposure to STD   HPI Crystal House is a 26 y.o. female is here complaining of vaginal itching, pain, discharge.  Patient states that she was exposed to trichomonas.  She [facility]  She states that she used Monistat initially when the itching began this time without any relief.  She denies any fever, chills, nausea or vomiting.  She denies any pain.   Past Medical History:  Diagnosis Date  . Asthma   . Bipolar disorder (HCC)    Pt is supposed to be taking medications and is not.     There are no active problems to display for this patient.   Past Surgical History:  Procedure Laterality Date  . CESAREAN SECTION     x1  . TONSILLECTOMY    . TYMPANOSTOMY TUBE PLACEMENT      Prior to Admission medications   Medication Sig Start Date End Date Taking? Authorizing Provider  traMADol (ULTRAM) 50 MG tablet Take 50 mg by mouth every 6 (six) hours as needed. For pain    [provider]    Allergies Latex and Other  History reviewed. No pertinent family history.  Social History Social History   Tobacco Use  . Smoking status: Former Smoker    Types: Cigarettes  . Smokeless tobacco: Never Used  Substance Use Topics  . Alcohol use: Yes    Comment: occasionally  . Drug use: Yes    Types: Marijuana    Review of Systems Constitutional: No fever/chills Cardiovascular: Denies chest pain. Respiratory: Denies shortness of breath. Gastrointestinal: No abdominal pain.  No nausea, no vomiting. Genitourinary: Negative for dysuria.  Positive for vaginal itching, burning, discharge. Musculoskeletal: Negative for back  pain. Neurological: Negative for headaches ____________________________________________   PHYSICAL EXAM:  VITAL SIGNS: ED Triage Vitals  Enc Vitals Group     BP 06/11/17 0922 130/87     Pulse Rate 06/11/17 0922 90     Resp 06/11/17 0922 18     Temp 06/11/17 0922 97.9 F (36.6 C)     Temp Source 06/11/17 0922 Oral     SpO2 06/11/17 0922 98 %     Weight 06/11/17 0919 (!) 350 lb (158.8 kg)     Height 06/11/17 0919 5\' 6"  (1.676 m)     Head Circumference --      Peak Flow --      Pain Score 06/11/17 0919 0     Pain Loc --      Pain Edu? --      Excl. in GC? --    Constitutional: Alert and oriented. Well appearing and in no acute distress. Eyes: Conjunctivae are normal.  Head: Atraumatic. Neck: No stridor.   Cardiovascular: Normal rate, regular rhythm. Grossly normal heart sounds.  Good peripheral circulation. Respiratory: Normal respiratory effort.  No retractions. Lungs CTAB. Gastrointestinal: Soft and nontender. No distention. No abdominal bruits. No CVA tenderness. Genitourinary: Vaginal walls with minimal exudate present.  Cervix is difficult to visualize due to patient size. Samples were obtained for GC and chlamydia and a wet prep. No adnexal masses or tenderness is noted. No tenderness on cervical motion. Musculoskeletal: No lower extremity tenderness nor edema.  No  joint effusions. Neurologic:  Normal speech and language. No gross focal neurologic deficits are appreciated. No gait instability. Skin:  Skin is warm, dry and intact. No rash noted. Psychiatric: Mood and affect are normal. Speech and behavior are normal.  ____________________________________________   LABS (all labs ordered are listed, but only abnormal results are displayed)  Labs Reviewed  WET PREP, GENITAL - Abnormal; Notable for the following components:      Result Value   WBC, Wet Prep HPF POC FEW (*)    All other components within normal limits  URINALYSIS, COMPLETE (UACMP) WITH MICROSCOPIC -  Abnormal; Notable for the following components:   Color, Urine YELLOW (*)    APPearance CLEAR (*)    Squamous Epithelial / LPF 0-5 (*)    All other components within normal limits  CHLAMYDIA/NGC RT PCR ([facility] ONLY)  POC URINE PREG, ED     PROCEDURES  Procedure(s) performed: None  Procedures  Critical Care performed: No  ____________________________________________   INITIAL IMPRESSION / ASSESSMENT AND PLAN / ED COURSE Patient was made aware that urine and wet prep were negative. Patient states that she noticed that she does have Trichomonas however she is made aware that I could not treat what is not documented in her chest. She is encouraged to follow-up with the health Department and states that she cannot go there as there is no one to take care of her children. At that time for discharge her GC and Gonorrhea test were pending,  patient is aware that she will be called if this is positive.  ____________________________________________   FINAL CLINICAL IMPRESSION(S) / ED DIAGNOSES  Final diagnoses:  Vaginal discomfort     ED Discharge Orders    None       Note:  This document was prepared using Dragon voice recognition software and may include unintentional dictation errors.   Tommi RumpsSummers, Jaunita Mikels L, PA-C 06/11/17 1437    Willy Eddyobinson, Patrick, MD 06/11/17 [phone]

## 2017-06-11 NOTE — ED Notes (Signed)
Pt ambulatory without difficulty to POV. VSS. NAD. Resp non-labored and equal. Skin PWD. No questions or concerns voiced during discharge.

## 2017-06-11 NOTE — ED Notes (Signed)
Urine Preg was negative.

## 2017-10-11 ENCOUNTER — Other Ambulatory Visit: Payer: Self-pay

## 2017-10-11 ENCOUNTER — Encounter: Payer: Self-pay | Admitting: Emergency Medicine

## 2017-10-11 ENCOUNTER — Emergency Department
Admission: EM | Admit: 2017-10-11 | Discharge: 2017-10-11 | Disposition: A | Discharge status: Home / Self Care | Payer: Self-pay | Attending: Emergency Medicine | Admitting: Emergency Medicine

## 2017-10-11 DIAGNOSIS — F1210 Cannabis abuse, uncomplicated: Secondary | ICD-10-CM | POA: Insufficient documentation

## 2017-10-11 DIAGNOSIS — X500XXA Overexertion from strenuous movement or load, initial encounter: Secondary | ICD-10-CM | POA: Insufficient documentation

## 2017-10-11 DIAGNOSIS — Y9301 Activity, walking, marching and hiking: Secondary | ICD-10-CM | POA: Insufficient documentation

## 2017-10-11 DIAGNOSIS — Z91040 Latex allergy status: Secondary | ICD-10-CM | POA: Insufficient documentation

## 2017-10-11 DIAGNOSIS — S39012A Strain of muscle, fascia and tendon of lower back, initial encounter: Secondary | ICD-10-CM | POA: Insufficient documentation

## 2017-10-11 DIAGNOSIS — J45909 Unspecified asthma, uncomplicated: Secondary | ICD-10-CM | POA: Insufficient documentation

## 2017-10-11 DIAGNOSIS — F17210 Nicotine dependence, cigarettes, uncomplicated: Secondary | ICD-10-CM | POA: Insufficient documentation

## 2017-10-11 DIAGNOSIS — Y998 Other external cause status: Secondary | ICD-10-CM | POA: Insufficient documentation

## 2017-10-11 DIAGNOSIS — Y9229 Other specified public building as the place of occurrence of the external cause: Secondary | ICD-10-CM | POA: Insufficient documentation

## 2017-10-11 LAB — URINALYSIS, COMPLETE (UACMP) WITH MICROSCOPIC
BILIRUBIN URINE: NEGATIVE
Bacteria, UA: NONE SEEN
GLUCOSE, UA: NEGATIVE mg/dL
HGB URINE DIPSTICK: NEGATIVE
Ketones, ur: NEGATIVE mg/dL
LEUKOCYTES UA: NEGATIVE
Nitrite: NEGATIVE
PH: 6 (ref 5.0–8.0)
PROTEIN: NEGATIVE mg/dL
Specific Gravity, Urine: 1.025 (ref 1.005–1.030)

## 2017-10-11 LAB — POCT PREGNANCY, URINE: Preg Test, Ur: NEGATIVE

## 2017-10-11 MED ORDER — MELOXICAM 15 MG PO TABS: 15.0000 mg | ORAL_TABLET | Freq: Every day | ORAL | 0 refills | Status: AC

## 2017-10-11 MED ORDER — KETOROLAC TROMETHAMINE 30 MG/ML IJ SOLN
30.0000 mg | Freq: Once | INTRAMUSCULAR | Status: AC
  Administered 2017-10-11: 30 mg via INTRAMUSCULAR
  Filled 2017-10-11: qty 1

## 2017-10-11 MED ORDER — ORPHENADRINE CITRATE 30 MG/ML IJ SOLN
60.0000 mg | Freq: Once | INTRAMUSCULAR | Status: AC
  Administered 2017-10-11: 60 mg via INTRAMUSCULAR
  Filled 2017-10-11 (×2): qty 2

## 2017-10-11 MED ORDER — METHOCARBAMOL 500 MG PO TABS: 500.0000 mg | ORAL_TABLET | Freq: Four times a day (QID) | ORAL | 0 refills | Status: AC

## 2017-10-11 NOTE — ED Triage Notes (Signed)
Pt presents to ED with c/o lower back pain x 3 days. Pt states some relief with Tylenol. Pt states pain worse with position changes and movement. Denies any known injury at this time.

## 2017-10-11 NOTE — ED Provider Notes (Signed)
[facility] Emergency Department Provider Note  ____________________________________________  Time seen: Approximately 7:59 PM  I have reviewed the triage vital signs and the nursing notes.   HISTORY  Chief Complaint Back Pain    HPI Crystal House is a 27 y.o. female who presents the emergency department complaining of lower back pain.  Patient reports that she was in the grocery store 3 days ago carrying her daughter when she felt a tight sensation in her lower back.  Patient has had a problem with lower back pain and sciatica states that she thought this was a flare of her sciatica.  She is been taking Tylenol Motrin with minimal relief.  Patient reports that she has had no bowel or bladder dysfunction, saddle anesthesias, paresthesias.  She denies any radicular pain.  Patient denies any urinary symptoms.  No bowel or bladder complaints.  No other complaints at this time.  No other medications other than Tylenol and Motrin.  Past Medical History:  Diagnosis Date  . Asthma   . Bipolar disorder (HCC)    Pt is supposed to be taking medications and is not.     There are no active problems to display for this patient.   Past Surgical History:  Procedure Laterality Date  . CESAREAN SECTION     x1  . TONSILLECTOMY    . TYMPANOSTOMY TUBE PLACEMENT      Prior to Admission medications   Medication Sig Start Date End Date Taking? Authorizing Provider  meloxicam (MOBIC) 15 MG tablet Take 1 tablet (15 mg total) by mouth daily. 10/11/17   Taylah Dubiel, Delorise Royals, PA-C  methocarbamol (ROBAXIN) 500 MG tablet Take 1 tablet (500 mg total) by mouth 4 (four) times daily. 10/11/17   Hussien Greenblatt, Delorise Royals, PA-C  traMADol (ULTRAM) 50 MG tablet Take 50 mg by mouth every 6 (six) hours as needed. For pain    [provider]    Allergies Latex and Other  No family history on file.  Social History Social History   Tobacco Use  . Smoking status: Current Every Day  Smoker    Types: Cigarettes  . Smokeless tobacco: Never Used  . Tobacco comment: 1 pack/ week  Substance Use Topics  . Alcohol use: Yes    Comment: occasionally  . Drug use: Yes    Types: Marijuana     Review of Systems  Constitutional: No fever/chills Eyes: No visual changes.  Cardiovascular: no chest pain. Respiratory: no cough. No SOB. Gastrointestinal: No abdominal pain.  No nausea, no vomiting.  No diarrhea.  No constipation. Genitourinary: Negative for dysuria. No hematuria Musculoskeletal: Negative for musculoskeletal pain. Skin: Negative for rash, abrasions, lacerations, ecchymosis. Neurological: Negative for headaches, focal weakness or numbness. 10-point ROS otherwise negative.  ____________________________________________   PHYSICAL EXAM:  VITAL SIGNS: ED Triage Vitals  Enc Vitals Group     BP 10/11/17 1823 119/67     Pulse Rate 10/11/17 1823 79     Resp 10/11/17 1823 17     Temp 10/11/17 1823 99.1 F (37.3 C)     Temp Source 10/11/17 1823 Oral     SpO2 10/11/17 1823 98 %     Weight 10/11/17 1823 (!) 347 lb (157.4 kg)     Height 10/11/17 1823 5\' 6"  (1.676 m)     Head Circumference --      Peak Flow --      Pain Score 10/11/17 1826 8     Pain Loc --  Pain Edu? --      Excl. in GC? --      Constitutional: Alert and oriented. Well appearing and in no acute distress.  Patient is morbidly obese. Eyes: Conjunctivae are normal. PERRL. EOMI. Head: Atraumatic. ENT:      Ears:       Nose: No congestion/rhinnorhea.      Mouth/Throat: Mucous membranes are moist.  Neck: No stridor.    Cardiovascular: Normal rate, regular rhythm. Normal S1 and S2.  Good peripheral circulation. Respiratory: Normal respiratory effort without tachypnea or retractions. Lungs CTAB. Good air entry to the bases with no decreased or absent breath sounds. Gastrointestinal: Bowel sounds 4 quadrants. Soft and nontender to palpation. No guarding or rigidity. No palpable masses. No  distention. No CVA tenderness. Musculoskeletal: Full range of motion to all extremities. No gross deformities appreciated.  No deformity or signs of trauma to the lumbar spine.  Diffuse tenderness in the lumbar region with no specific point tenderness.  No palpable abnormality or step-off.  No tenderness to palpation bilateral sciatic nerves.  Patient is unable to perform straight leg raise due to habitus and strength of lower extremity.  Dorsalis pedis pulse intact bilateral lower extremity.  Sensation intact and equal bilateral lower extremity. Neurologic:  Normal speech and language. No gross focal neurologic deficits are appreciated.  Skin:  Skin is warm, dry and intact. No rash noted. Psychiatric: Mood and affect are normal. Speech and behavior are normal. Patient exhibits appropriate insight and judgement.   ____________________________________________   LABS (all labs ordered are listed, but only abnormal results are displayed)  Labs Reviewed  URINALYSIS, COMPLETE (UACMP) WITH MICROSCOPIC - Abnormal; Notable for the following components:      Result Value   Color, Urine YELLOW (*)    APPearance CLEAR (*)    Squamous Epithelial / LPF 0-5 (*)    All other components within normal limits  POCT PREGNANCY, URINE   ____________________________________________  EKG   ____________________________________________  RADIOLOGY   No results found.  ____________________________________________    PROCEDURES  Procedure(s) performed:    Procedures    Medications  ketorolac (TORADOL) 30 MG/ML injection 30 mg (has no administration in time range)  orphenadrine (NORFLEX) injection 60 mg (has no administration in time range)     ____________________________________________   INITIAL IMPRESSION / ASSESSMENT AND PLAN / ED COURSE  Pertinent labs & imaging results that were available during my care of the patient were reviewed by me and considered in my medical decision making  (see chart for details).  Review of the [place] CSRS was performed in accordance of the [facility] prior to dispensing any controlled drugs.     Patient's diagnosis is consistent with lumbar strain.  Patient presents with lower back pain times 3 days.  No direct trauma.  No urinary symptoms.  Urinalysis returns with no signs of infection.  Differential included strain versus contusion versus disc rupture versus UTI.  Exam was overall reassuring.  No indication for further workup.  Patient will be given Toradol muscle relaxer.. Patient will be discharged home with prescriptions for meloxicam and Robaxin. Patient is to follow up with primary care as needed or otherwise directed. Patient is given ED precautions to return to the ED for any worsening or new symptoms.     ____________________________________________  FINAL CLINICAL IMPRESSION(S) / ED DIAGNOSES  Final diagnoses:  Strain of lumbar region, initial encounter      NEW MEDICATIONS STARTED DURING THIS VISIT:  ED Discharge Orders  Ordered    meloxicam (MOBIC) 15 MG tablet  Daily     10/11/17 2011    methocarbamol (ROBAXIN) 500 MG tablet  4 times daily     10/11/17 2011          This chart was dictated using voice recognition software/Dragon. Despite best efforts to proofread, errors can occur which can change the meaning. Any change was purely unintentional.    Racheal Patches, PA-C 10/11/17 2012    Phineas Semen, MD 10/12/17 1504

## 2017-12-24 ENCOUNTER — Other Ambulatory Visit: Payer: Self-pay

## 2017-12-24 ENCOUNTER — Emergency Department
Admission: EM | Admit: 2017-12-24 | Discharge: 2017-12-24 | Disposition: A | Discharge status: Home / Self Care | Payer: Medicaid Other | Attending: Emergency Medicine | Admitting: Emergency Medicine

## 2017-12-24 ENCOUNTER — Encounter: Payer: Self-pay | Admitting: Physician Assistant

## 2017-12-24 DIAGNOSIS — Z91040 Latex allergy status: Secondary | ICD-10-CM | POA: Insufficient documentation

## 2017-12-24 DIAGNOSIS — Z79899 Other long term (current) drug therapy: Secondary | ICD-10-CM | POA: Insufficient documentation

## 2017-12-24 DIAGNOSIS — L03115 Cellulitis of right lower limb: Secondary | ICD-10-CM | POA: Insufficient documentation

## 2017-12-24 DIAGNOSIS — F17210 Nicotine dependence, cigarettes, uncomplicated: Secondary | ICD-10-CM | POA: Insufficient documentation

## 2017-12-24 DIAGNOSIS — J45909 Unspecified asthma, uncomplicated: Secondary | ICD-10-CM | POA: Insufficient documentation

## 2017-12-24 DIAGNOSIS — F319 Bipolar disorder, unspecified: Secondary | ICD-10-CM | POA: Insufficient documentation

## 2017-12-24 DIAGNOSIS — A599 Trichomoniasis, unspecified: Secondary | ICD-10-CM

## 2017-12-24 DIAGNOSIS — A5900 Urogenital trichomoniasis, unspecified: Secondary | ICD-10-CM | POA: Insufficient documentation

## 2017-12-24 LAB — URINALYSIS, COMPLETE (UACMP) WITH MICROSCOPIC
Bilirubin Urine: NEGATIVE
Glucose, UA: NEGATIVE mg/dL
HGB URINE DIPSTICK: NEGATIVE
Ketones, ur: NEGATIVE mg/dL
Nitrite: NEGATIVE
PH: 5 (ref 5.0–8.0)
Protein, ur: NEGATIVE mg/dL
SPECIFIC GRAVITY, URINE: 1.026 (ref 1.005–1.030)
WBC, UA: >50 WBC/hpf — ABNORMAL HIGH (ref 0–5)

## 2017-12-24 LAB — WET PREP, GENITAL
Clue Cells Wet Prep HPF POC: NONE SEEN
SPERM: NONE SEEN
YEAST WET PREP: NONE SEEN

## 2017-12-24 LAB — CHLAMYDIA/NGC RT PCR (ARMC ONLY)
CHLAMYDIA TR: NOT DETECTED
N GONORRHOEAE: NOT DETECTED

## 2017-12-24 LAB — POCT PREGNANCY, URINE: Preg Test, Ur: NEGATIVE

## 2017-12-24 MED ORDER — DOXYCYCLINE HYCLATE 100 MG PO TABS: 100.0000 mg | ORAL_TABLET | Freq: Two times a day (BID) | ORAL | 0 refills | Status: DC

## 2017-12-24 MED ORDER — METRONIDAZOLE 500 MG PO TABS
ORAL_TABLET | ORAL | Status: AC
  Filled 2017-12-24: qty 3

## 2017-12-24 MED ORDER — METRONIDAZOLE 500 MG PO TABS
2000.0000 mg | ORAL_TABLET | Freq: Once | ORAL | Status: AC
  Administered 2017-12-24: 2000 mg via ORAL

## 2017-12-24 MED ORDER — DOXYCYCLINE HYCLATE 100 MG PO TABS: 100.0000 mg | ORAL_TABLET | Freq: Two times a day (BID) | ORAL | 0 refills | Status: AC

## 2017-12-24 MED ORDER — METRONIDAZOLE 500 MG PO TABS
500.0000 mg | ORAL_TABLET | Freq: Once | ORAL | Status: DC
  Filled 2017-12-24: qty 1

## 2017-12-24 MED ORDER — DOXYCYCLINE HYCLATE 100 MG PO TABS
100.0000 mg | ORAL_TABLET | Freq: Once | ORAL | Status: AC
  Administered 2017-12-24: 100 mg via ORAL
  Filled 2017-12-24: qty 1

## 2017-12-24 MED ORDER — LIDOCAINE HCL (PF) 1 % IJ SOLN
10.0000 mL | Freq: Once | INTRAMUSCULAR | Status: DC
  Filled 2017-12-24: qty 10

## 2017-12-24 MED ORDER — DOXYCYCLINE HYCLATE 100 MG PO TABS
ORAL_TABLET | ORAL | Status: AC
  Filled 2017-12-24: qty 1

## 2017-12-24 NOTE — ED Triage Notes (Signed)
Reports symptoms for couple days.  Reports abscess on right inner thigh, also on labia.  Patient also reports having green vaginal discharge with odor.

## 2017-12-24 NOTE — ED Notes (Signed)
Will defer physical exam of discharge to PA.

## 2017-12-24 NOTE — ED Provider Notes (Signed)
[facility] Emergency Department Provider Note ____________________________________________  Time seen: 2100  I have reviewed the triage vital signs and the nursing notes.  HISTORY  Chief Complaint  Abscess and Vaginal Discharge  HPI Crystal House is a 27 y.o. female presents to the ED for evaluation of vaginal discharge.  Patient also has an unrelated complaint of a tender small abscess to the anterior right thigh.  She denies any fevers, chills, sweats patient does admit to an unprotected sexual encounter for the last week.  She reports symptoms of vaginal discharge and malodorous discharge for the last 2 to 3 days.  She also reports she has not had a menstrual period since May.  She has taken 2 pregnancy test at home which have both been negative according to the patient.  Past Medical History:  Diagnosis Date  . Asthma   . Bipolar disorder (HCC)    Pt is supposed to be taking medications and is not.     There are no active problems to display for this patient.   Past Surgical History:  Procedure Laterality Date  . CESAREAN SECTION     x1  . TONSILLECTOMY    . TYMPANOSTOMY TUBE PLACEMENT      Prior to Admission medications   Medication Sig Start Date End Date Taking? Authorizing Provider  doxycycline (VIBRA-TABS) 100 MG tablet Take 1 tablet (100 mg total) by mouth 2 (two) times daily. 12/24/17   Graden Hoshino, Charlesetta IvoryJenise V Bacon, PA-C  meloxicam (MOBIC) 15 MG tablet Take 1 tablet (15 mg total) by mouth daily. 10/11/17   Cuthriell, Delorise RoyalsJonathan D, PA-C  methocarbamol (ROBAXIN) 500 MG tablet Take 1 tablet (500 mg total) by mouth 4 (four) times daily. 10/11/17   Cuthriell, Delorise RoyalsJonathan D, PA-C  traMADol (ULTRAM) 50 MG tablet Take 50 mg by mouth every 6 (six) hours as needed. For pain    [provider]   Allergies Latex and Other  No family history on file.  Social History Social History   Tobacco Use  . Smoking status: Current Every Day Smoker    Types:  Cigarettes  . Smokeless tobacco: Never Used  . Tobacco comment: 1 pack/ week  Substance Use Topics  . Alcohol use: Yes    Comment: occasionally  . Drug use: Yes    Types: Marijuana    Review of Systems  Constitutional: Negative for fever. Cardiovascular: Negative for chest pain. Respiratory: Negative for shortness of breath. Gastrointestinal: Negative for abdominal pain, vomiting and diarrhea. Genitourinary: Negative for dysuria. Reports malodorous vaginal discharge Musculoskeletal: Negative for back pain. Skin: Negative for rash. Has small abscess to the right thigh Neurological: Negative for headaches, focal weakness or numbness. ____________________________________________  PHYSICAL EXAM:  VITAL SIGNS: ED Triage Vitals  Enc Vitals Group     BP 12/24/17 2049 140/80     Pulse Rate 12/24/17 2049 (!) 105     Resp 12/24/17 2049 18     Temp 12/24/17 2049 98.8 F (37.1 C)     Temp Source 12/24/17 2049 Oral     SpO2 12/24/17 2049 100 %     Weight 12/24/17 2050 (!) 360 lb (163.3 kg)     Height 12/24/17 2050 5\' 6"  (1.676 m)     Head Circumference --      Peak Flow --      Pain Score 12/24/17 2050 4     Pain Loc --      Pain Edu? --      Excl. in GC? --  Constitutional: Alert and oriented. Well appearing and in no distress. Head: Normocephalic and atraumatic. Cardiovascular: Normal rate, regular rhythm. Normal distal pulses. Respiratory: Normal respiratory effort.  GU normal external genitalia. Patient with a thin, greenish, malodorous discharge in the vaginal. Cervix is irritated and positioned to the right. Positive CMT Musculoskeletal: Nontender with normal range of motion in all extremities.  Neurologic:  Normal gait without ataxia. Normal speech and language. No gross focal neurologic deficits are appreciated. Skin:  Skin is warm, dry and intact. No rash noted. ____________________________________________   LABS (pertinent positives/negatives)  Labs Reviewed   WET PREP, GENITAL - Abnormal; Notable for the following components:      Result Value   Trich, Wet Prep PRESENT (*)    WBC, Wet Prep HPF POC MANY (*)    All other components within normal limits  URINALYSIS, COMPLETE (UACMP) WITH MICROSCOPIC - Abnormal; Notable for the following components:   Color, Urine YELLOW (*)    APPearance HAZY (*)    Leukocytes, UA MODERATE (*)    WBC, UA >50 (*)    Bacteria, UA RARE (*)    All other components within normal limits  CHLAMYDIA/NGC RT PCR ([facility] ONLY)  POC URINE PREG, ED  POCT PREGNANCY, URINE  ____________________________________________  PROCEDURES  Procedures Metronidazole 2 g PO Doxycycline 100 mg p.o. ____________________________________________  INITIAL IMPRESSION / ASSESSMENT AND PLAN / ED COURSE  Patient with ED evaluation of malodorous vaginal discharge.  Patient is confirmed to have trichomoniasis on a wet prep.  She will be treated with a single ED dose of metronidazole prior to discharge.  She is also given a dose of doxycycline for her right thigh cellulitis.  Patient will continue to apply warm compress to the left thigh to promote healing.  She will return for any recheck for possible I&D procedure.  A prescription for doxycycline is provided for dose as directed.  GC culture is pending at the time of discharge. Return precautions have been reviewed. ____________________________________________  FINAL CLINICAL IMPRESSION(S) / ED DIAGNOSES  Final diagnoses:  Trichimoniasis  Cellulitis of right lower extremity      Vee Bahe, Charlesetta Ivory, PA-C 12/24/17 2255    Don Perking, [place], MD 12/25/17 1504

## 2017-12-24 NOTE — Discharge Instructions (Addendum)
Take the prescription as directed. Apply warm compresses to the upper thigh to promote healing. Return as needed.

## 2018-03-27 LAB — HM HIV SCREENING LAB: HM HIV SCREENING: NEGATIVE

## 2018-08-01 IMAGING — US US OB TRANSVAGINAL
1 series · 14 of 28 positions shown · non-contrast
Comparison: None.

CLINICAL DATA: Pregnant, abdominal pain

EXAM:
OBSTETRIC <14 WK US AND TRANSVAGINAL OB US
TECHNIQUE: Both transabdominal and transvaginal ultrasound examinations were
performed for complete evaluation of the gestation as well as the
maternal uterus, adnexal regions, and pelvic cul-de-sac.
Transvaginal technique was performed to assess early pregnancy.

[Series 1: us ob transvaginal · 0.22mm/px · 14 of 73 slices shown]
[im 3/73]
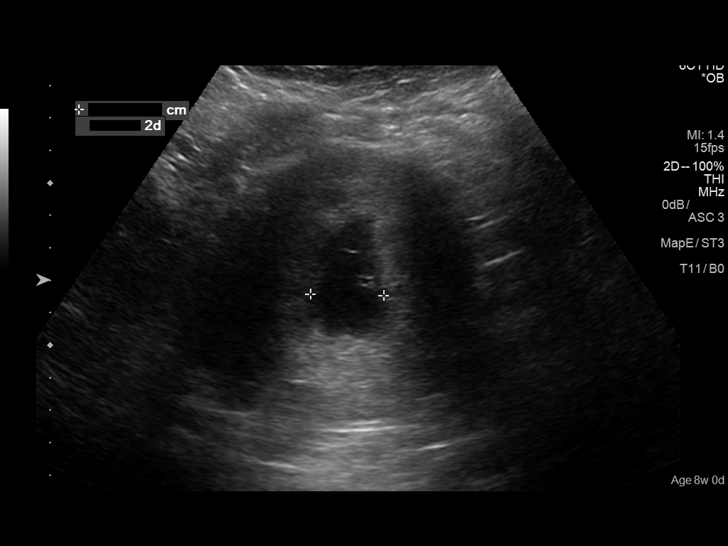
[im 9/73]
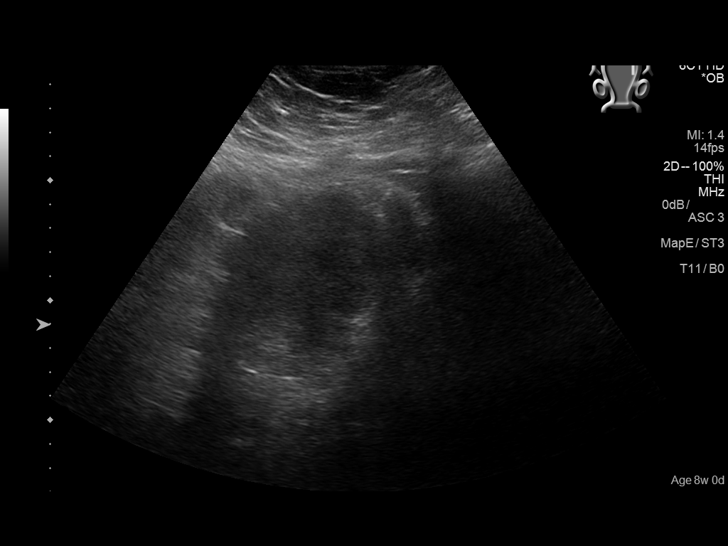
[im 14/73]
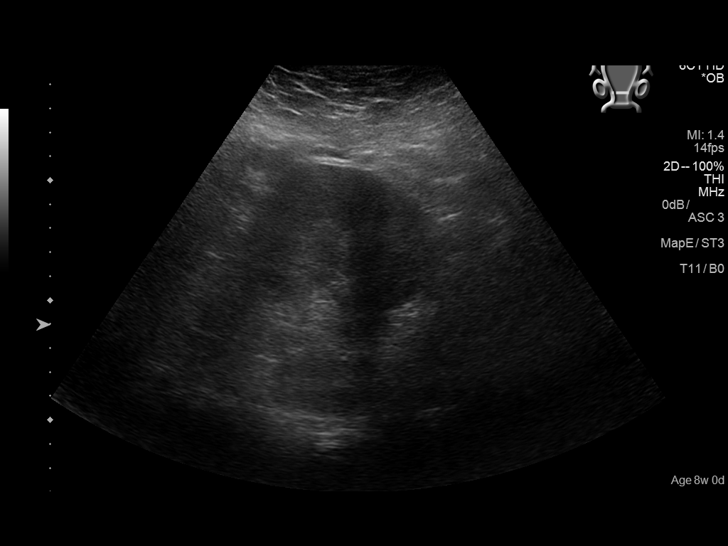
[im 19/73]
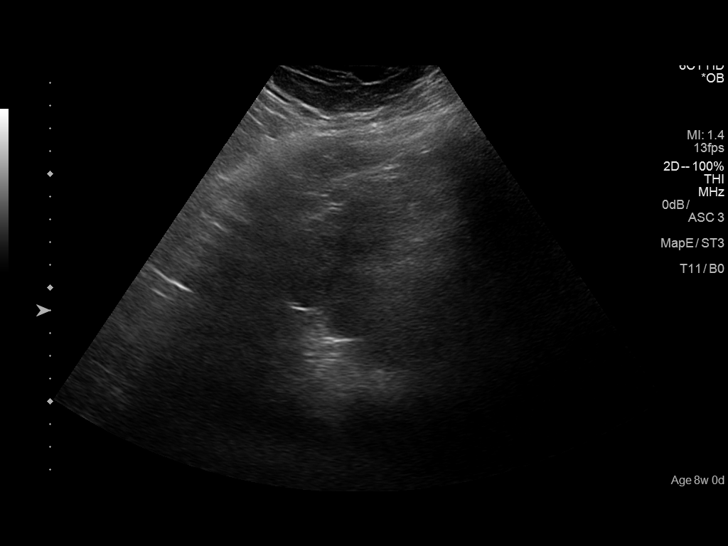
[im 25/73]
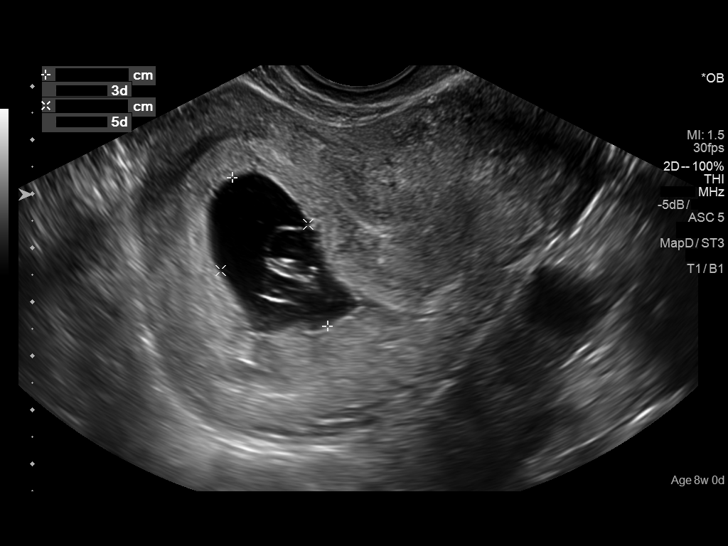
[im 30/73]
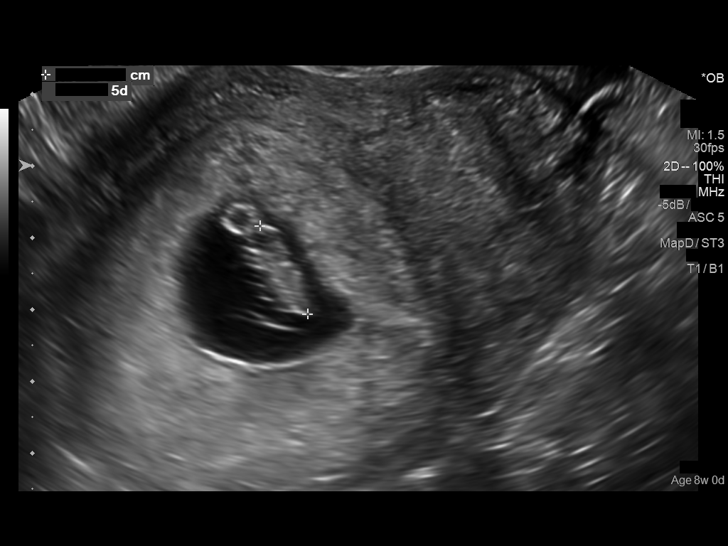
[im 35/73]
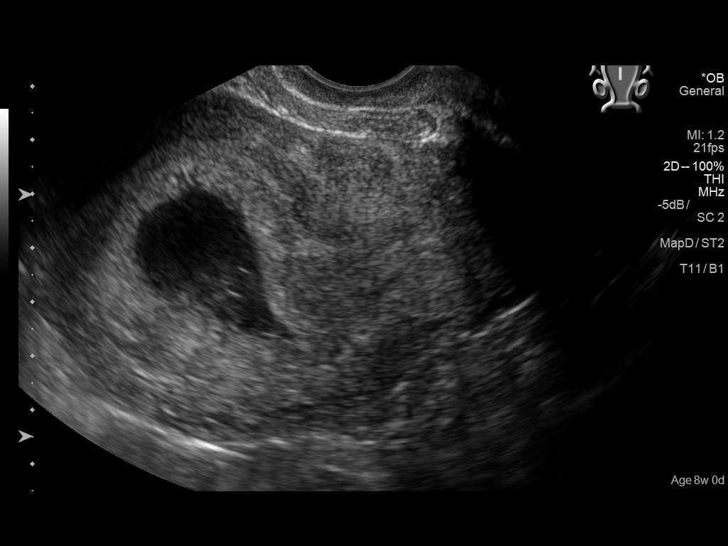
[im 41/73]
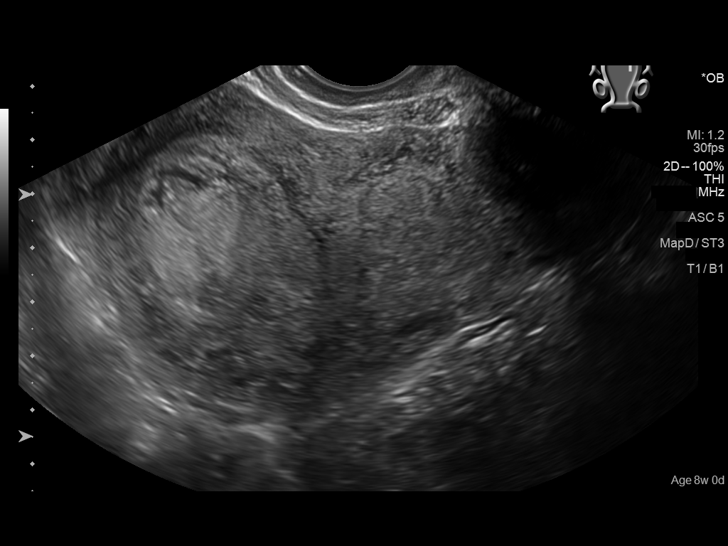
[im 46/73]
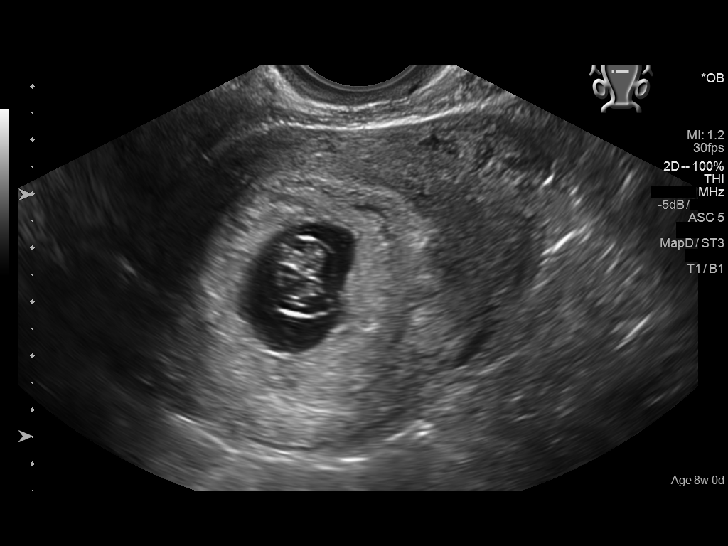
[im 51/73]
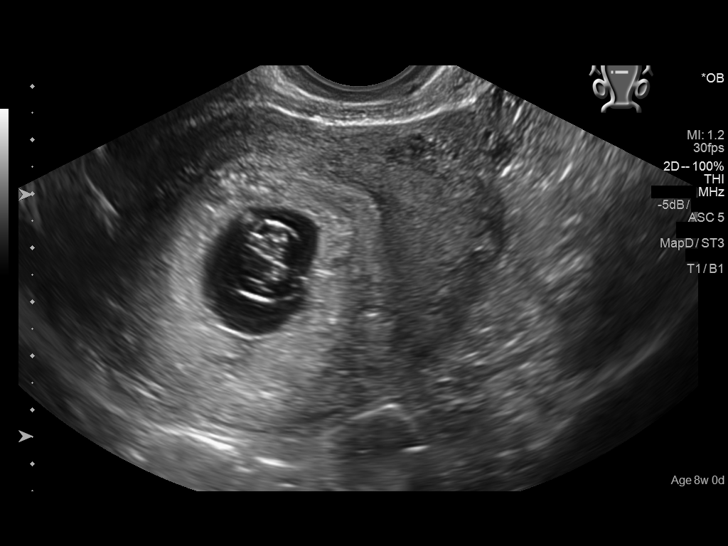
[im 57/73]
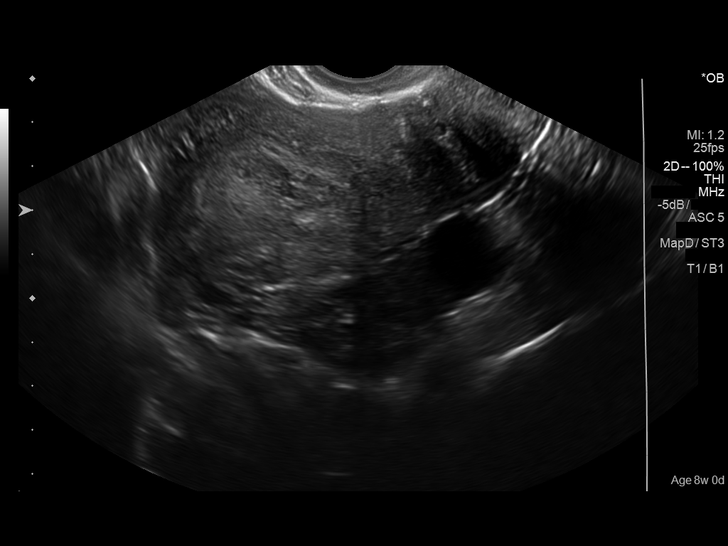
[im 62/73]
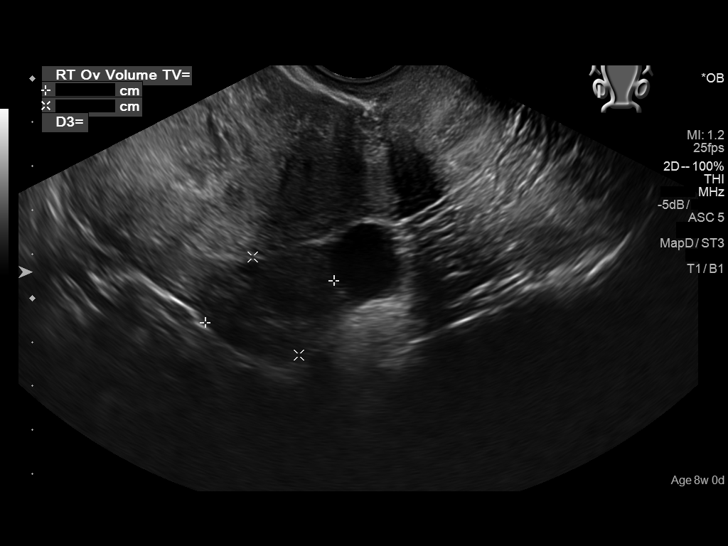
[im 67/73]
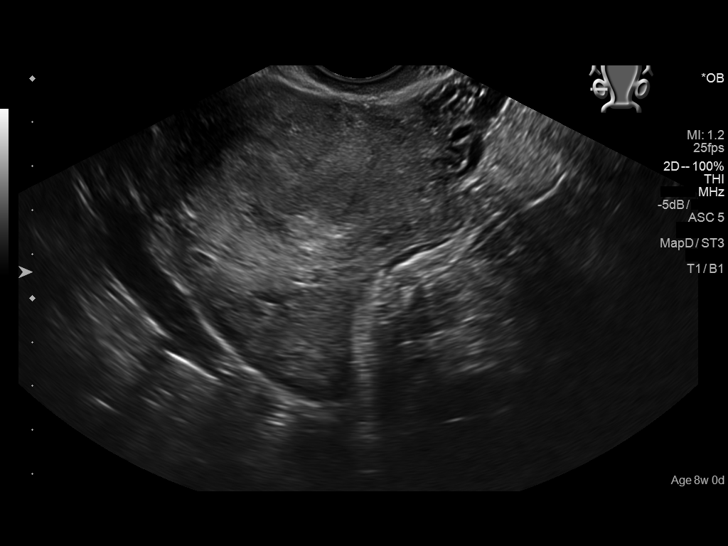
[im 73/73]
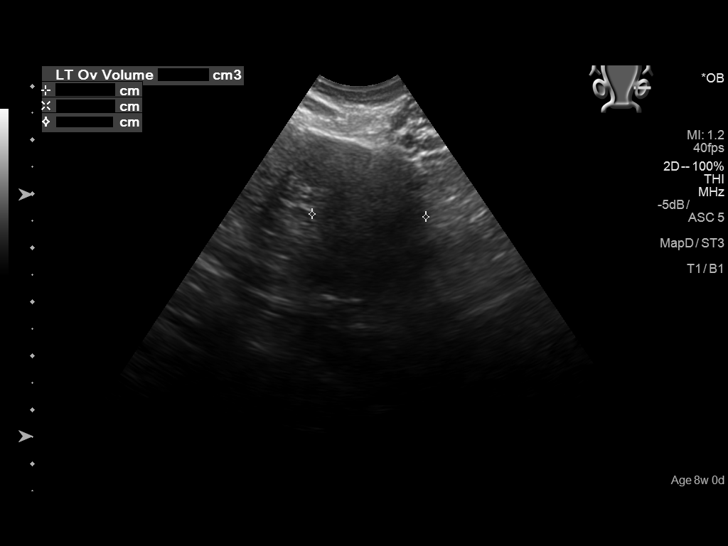

[14 of 28 positions shown; findings below may reference images not displayed]

FINDINGS: Intrauterine gestational sac: Single

Yolk sac:  Present

Embryo:  Present

Cardiac Activity: Present

Heart Rate: 160  bpm

CRL:  14.6  mm   7 w   6 d                  US EDC: 09/09/2016

Subchorionic hemorrhage:  None visualized.

Maternal uterus/adnexae: Bilateral ovaries are within normal limits,
noting a right corpus luteal cyst.

No free fluid.
IMPRESSION: Single live intrauterine gestation, with estimated gestational age 7
weeks 6 days.

## 2018-12-11 ENCOUNTER — Emergency Department
Admission: EM | Admit: 2018-12-11 | Discharge: 2018-12-12 | Disposition: A | Discharge status: Home / Self Care | Payer: Self-pay | Attending: Emergency Medicine | Admitting: Emergency Medicine

## 2018-12-11 ENCOUNTER — Other Ambulatory Visit: Payer: Self-pay

## 2018-12-11 ENCOUNTER — Encounter: Payer: Self-pay | Admitting: Emergency Medicine

## 2018-12-11 ENCOUNTER — Emergency Department: Payer: Self-pay

## 2018-12-11 DIAGNOSIS — O26891 Other specified pregnancy related conditions, first trimester: Secondary | ICD-10-CM | POA: Insufficient documentation

## 2018-12-11 DIAGNOSIS — R102 Pelvic and perineal pain: Secondary | ICD-10-CM | POA: Insufficient documentation

## 2018-12-11 DIAGNOSIS — O468X1 Other antepartum hemorrhage, first trimester: Secondary | ICD-10-CM

## 2018-12-11 DIAGNOSIS — Z3A10 10 weeks gestation of pregnancy: Secondary | ICD-10-CM | POA: Insufficient documentation

## 2018-12-11 DIAGNOSIS — Z91040 Latex allergy status: Secondary | ICD-10-CM | POA: Insufficient documentation

## 2018-12-11 DIAGNOSIS — Z79899 Other long term (current) drug therapy: Secondary | ICD-10-CM | POA: Insufficient documentation

## 2018-12-11 DIAGNOSIS — J45909 Unspecified asthma, uncomplicated: Secondary | ICD-10-CM | POA: Insufficient documentation

## 2018-12-11 DIAGNOSIS — O418X10 Other specified disorders of amniotic fluid and membranes, first trimester, not applicable or unspecified: Secondary | ICD-10-CM | POA: Insufficient documentation

## 2018-12-11 DIAGNOSIS — Z87891 Personal history of nicotine dependence: Secondary | ICD-10-CM | POA: Insufficient documentation

## 2018-12-11 LAB — COMPREHENSIVE METABOLIC PANEL
ALT: 18 U/L (ref 0–44)
AST: 15 U/L (ref 15–41)
Albumin: 3.7 g/dL (ref 3.5–5.0)
Alkaline Phosphatase: 75 U/L (ref 38–126)
Anion gap: 10 (ref 5–15)
BUN: 11 mg/dL (ref 6–20)
CO2: 24 mmol/L (ref 22–32)
Calcium: 9.2 mg/dL (ref 8.9–10.3)
Chloride: 105 mmol/L (ref 98–111)
Creatinine, Ser: 0.63 mg/dL (ref 0.44–1.00)
GFR calc Af Amer: >60 mL/min (ref >60)
GFR calc non Af Amer: >60 mL/min (ref >60)
Glucose, Bld: 96 mg/dL (ref 70–99)
Potassium: 3.9 mmol/L (ref 3.5–5.1)
Sodium: 139 mmol/L (ref 135–145)
Total Bilirubin: 0.2 mg/dL — ABNORMAL LOW (ref 0.3–1.2)
Total Protein: 7.1 g/dL (ref 6.5–8.1)

## 2018-12-11 LAB — URINALYSIS, COMPLETE (UACMP) WITH MICROSCOPIC
Bacteria, UA: NONE SEEN
Bilirubin Urine: NEGATIVE
Glucose, UA: 50 mg/dL — AB
Ketones, ur: NEGATIVE mg/dL
Leukocytes,Ua: NEGATIVE
Nitrite: NEGATIVE
Protein, ur: NEGATIVE mg/dL
Specific Gravity, Urine: 1.01 (ref 1.005–1.030)
pH: 6 (ref 5.0–8.0)

## 2018-12-11 LAB — CBC WITH DIFFERENTIAL/PLATELET
Abs Immature Granulocytes: 0.04 10*3/uL (ref 0.00–0.07)
Basophils Absolute: 0.1 10*3/uL (ref 0.0–0.1)
Basophils Relative: 0 %
Eosinophils Absolute: 0.1 10*3/uL (ref 0.0–0.5)
Eosinophils Relative: 1 %
HCT: 41.1 % (ref 36.0–46.0)
Hemoglobin: 13.2 g/dL (ref 12.0–15.0)
Immature Granulocytes: 0 %
Lymphocytes Relative: 25 %
Lymphs Abs: 4.3 10*3/uL — ABNORMAL HIGH (ref 0.7–4.0)
MCH: 26.1 pg (ref 26.0–34.0)
MCHC: 32.1 g/dL (ref 30.0–36.0)
MCV: 81.2 fL (ref 80.0–100.0)
Monocytes Absolute: 0.9 10*3/uL (ref 0.1–1.0)
Monocytes Relative: 6 %
Neutro Abs: 11.5 10*3/uL — ABNORMAL HIGH (ref 1.7–7.7)
Neutrophils Relative %: 68 %
Platelets: 434 10*3/uL — ABNORMAL HIGH (ref 150–400)
RBC: 5.06 MIL/uL (ref 3.87–5.11)
RDW: 16.2 % — ABNORMAL HIGH (ref 11.5–15.5)
WBC: 17 10*3/uL — ABNORMAL HIGH (ref 4.0–10.5)
nRBC: 0 % (ref 0.0–0.2)

## 2018-12-11 LAB — POCT PREGNANCY, URINE: Preg Test, Ur: POSITIVE — AB

## 2018-12-11 LAB — HCG, QUANTITATIVE, PREGNANCY: hCG, Beta Chain, Quant, S: 72504 m[IU]/mL — ABNORMAL HIGH (ref <5)

## 2018-12-11 NOTE — ED Triage Notes (Addendum)
Patient ambulatory to triage with steady gait, without difficulty or distress noted; pt reports vag bleeding and cramping today; approx [redacted]wks pregnant; G3 P2; pt is O+ as indicated in chart results
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
## 2018-12-11 NOTE — ED Notes (Signed)
Patient transported to Ultrasound 

## 2018-12-11 NOTE — ED Notes (Signed)
Lab called to run hCG off of previously collected blood.

## 2019-02-02 ENCOUNTER — Other Ambulatory Visit: Payer: Self-pay

## 2019-02-02 ENCOUNTER — Emergency Department
Admission: EM | Admit: 2019-02-02 | Discharge: 2019-02-02 | Disposition: A | Discharge status: Home / Self Care | Payer: Medicaid Other | Attending: Emergency Medicine | Admitting: Emergency Medicine

## 2019-02-02 ENCOUNTER — Encounter: Payer: Self-pay | Admitting: Emergency Medicine

## 2019-02-02 DIAGNOSIS — J45909 Unspecified asthma, uncomplicated: Secondary | ICD-10-CM | POA: Diagnosis not present

## 2019-02-02 DIAGNOSIS — Z20828 Contact with and (suspected) exposure to other viral communicable diseases: Secondary | ICD-10-CM | POA: Diagnosis not present

## 2019-02-02 DIAGNOSIS — R05 Cough: Secondary | ICD-10-CM | POA: Insufficient documentation

## 2019-02-02 DIAGNOSIS — Z91040 Latex allergy status: Secondary | ICD-10-CM | POA: Insufficient documentation

## 2019-02-02 DIAGNOSIS — R638 Other symptoms and signs concerning food and fluid intake: Secondary | ICD-10-CM | POA: Insufficient documentation

## 2019-02-02 DIAGNOSIS — R0602 Shortness of breath: Secondary | ICD-10-CM | POA: Diagnosis not present

## 2019-02-02 DIAGNOSIS — J029 Acute pharyngitis, unspecified: Secondary | ICD-10-CM | POA: Diagnosis not present

## 2019-02-02 DIAGNOSIS — Z79899 Other long term (current) drug therapy: Secondary | ICD-10-CM | POA: Diagnosis not present

## 2019-02-02 DIAGNOSIS — Z20822 Contact with and (suspected) exposure to covid-19: Secondary | ICD-10-CM

## 2019-02-02 DIAGNOSIS — Z87891 Personal history of nicotine dependence: Secondary | ICD-10-CM | POA: Insufficient documentation

## 2019-02-02 LAB — COMPREHENSIVE METABOLIC PANEL
ALT: 10 U/L (ref 0–44)
AST: 11 U/L — ABNORMAL LOW (ref 15–41)
Albumin: 3.3 g/dL — ABNORMAL LOW (ref 3.5–5.0)
Alkaline Phosphatase: 70 U/L (ref 38–126)
Anion gap: 10 (ref 5–15)
BUN: 5 mg/dL — ABNORMAL LOW (ref 6–20)
CO2: 20 mmol/L — ABNORMAL LOW (ref 22–32)
Calcium: 8.6 mg/dL — ABNORMAL LOW (ref 8.9–10.3)
Chloride: 106 mmol/L (ref 98–111)
Creatinine, Ser: 0.43 mg/dL — ABNORMAL LOW (ref 0.44–1.00)
GFR calc Af Amer: >60 mL/min (ref >60)
GFR calc non Af Amer: >60 mL/min (ref >60)
Glucose, Bld: 106 mg/dL — ABNORMAL HIGH (ref 70–99)
Potassium: 3.6 mmol/L (ref 3.5–5.1)
Sodium: 136 mmol/L (ref 135–145)
Total Bilirubin: 1.1 mg/dL (ref 0.3–1.2)
Total Protein: 6.8 g/dL (ref 6.5–8.1)

## 2019-02-02 LAB — CBC WITH DIFFERENTIAL/PLATELET
Abs Immature Granulocytes: 0.09 10*3/uL — ABNORMAL HIGH (ref 0.00–0.07)
Basophils Absolute: 0 10*3/uL (ref 0.0–0.1)
Basophils Relative: 0 %
Eosinophils Absolute: 0.1 10*3/uL (ref 0.0–0.5)
Eosinophils Relative: 0 %
HCT: 36.6 % (ref 36.0–46.0)
Hemoglobin: 11.9 g/dL — ABNORMAL LOW (ref 12.0–15.0)
Immature Granulocytes: 1 %
Lymphocytes Relative: 8 %
Lymphs Abs: 1.5 10*3/uL (ref 0.7–4.0)
MCH: 26.1 pg (ref 26.0–34.0)
MCHC: 32.5 g/dL (ref 30.0–36.0)
MCV: 80.3 fL (ref 80.0–100.0)
Monocytes Absolute: 0.7 10*3/uL (ref 0.1–1.0)
Monocytes Relative: 4 %
Neutro Abs: 16.2 10*3/uL — ABNORMAL HIGH (ref 1.7–7.7)
Neutrophils Relative %: 87 %
Platelets: 398 10*3/uL (ref 150–400)
RBC: 4.56 MIL/uL (ref 3.87–5.11)
RDW: 14.9 % (ref 11.5–15.5)
WBC: 18.6 10*3/uL — ABNORMAL HIGH (ref 4.0–10.5)
nRBC: 0 % (ref 0.0–0.2)

## 2019-02-02 LAB — URINALYSIS, COMPLETE (UACMP) WITH MICROSCOPIC
Bacteria, UA: NONE SEEN
Bilirubin Urine: NEGATIVE
Glucose, UA: NEGATIVE mg/dL
Hgb urine dipstick: NEGATIVE
Ketones, ur: 20 mg/dL — AB
Leukocytes,Ua: NEGATIVE
Nitrite: NEGATIVE
Protein, ur: NEGATIVE mg/dL
Specific Gravity, Urine: 1.024 (ref 1.005–1.030)
pH: 5 (ref 5.0–8.0)

## 2019-02-02 MED ORDER — METOCLOPRAMIDE HCL 10 MG PO TABS: 10.0000 mg | ORAL_TABLET | Freq: Three times a day (TID) | ORAL | 0 refills | Status: AC | PRN

## 2019-02-02 MED ORDER — SODIUM CHLORIDE 0.9 % IV BOLUS
1000.0000 mL | Freq: Once | INTRAVENOUS | Status: AC
  Administered 2019-02-02: 1000 mL via INTRAVENOUS

## 2019-02-02 MED ORDER — PREDNISONE 10 MG (21) PO TBPK: ORAL_TABLET | ORAL | 0 refills | Status: AC

## 2019-02-02 MED ORDER — ALBUTEROL SULFATE HFA 108 (90 BASE) MCG/ACT IN AERS: 2.0000 | INHALATION_SPRAY | Freq: Four times a day (QID) | RESPIRATORY_TRACT | 0 refills | Status: AC | PRN

## 2019-02-02 NOTE — ED Notes (Signed)
Pt lying in bed speaking with this RN in NAD, A&Ox4.  
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
## 2019-02-02 NOTE — Discharge Instructions (Addendum)
Please seek medical attention for any high fevers, chest pain, shortness of breath, change in behavior, persistent vomiting, bloody stool or any other new or concerning symptoms.  

## 2019-02-02 NOTE — ED Triage Notes (Addendum)
Patient states she has been sick since Wednesday.  Patient is approx. [redacted] weeks pregnant and states she hasn't felt her baby move in the past few days.  Patient states every time she coughs she feels her uterus, "move".  Patient reports cough and shortness of breath on exertion.  Patient states when she eats it causes her to have difficulty breathing.  Patient denies fever.  Patient states her husband has similar symptoms.  Patient states her mother, father, sister, and brother-in-law had covid19 two weeks ago.  Husband was around family last week.

## 2019-02-02 NOTE — ED Notes (Signed)
C/o body aches, SOB and fatigue X 3-4 days. Pt 18 weeks pregnancy and states decreased fetal movement. No prenatal care. Positive COVID contacts in family. Unable to obtain FHR at this time.

## 2019-02-05 LAB — NOVEL CORONAVIRUS, NAA (HOSP ORDER, SEND-OUT TO REF LAB; TAT 18-24 HRS): SARS-CoV-2, NAA: NOT DETECTED

## 2020-04-10 IMAGING — US OBSTETRIC <14 WK ULTRASOUND
1 series · 14 of 28 positions shown · non-contrast
Comparison: None.

CLINICAL DATA: Cramping, vaginal bleeding

EXAM:
OBSTETRIC <14 WK ULTRASOUND
TECHNIQUE: Transabdominal ultrasound was performed for evaluation of the
gestation as well as the maternal uterus and adnexal regions.

[Series 1: obstetric <14 wk ultrasound · 14 of 58 slices shown]
[im 3/58]
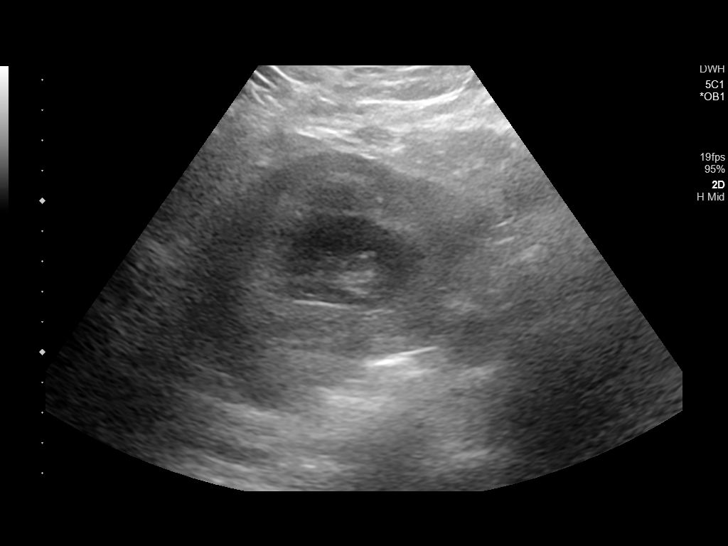
[im 7/58]
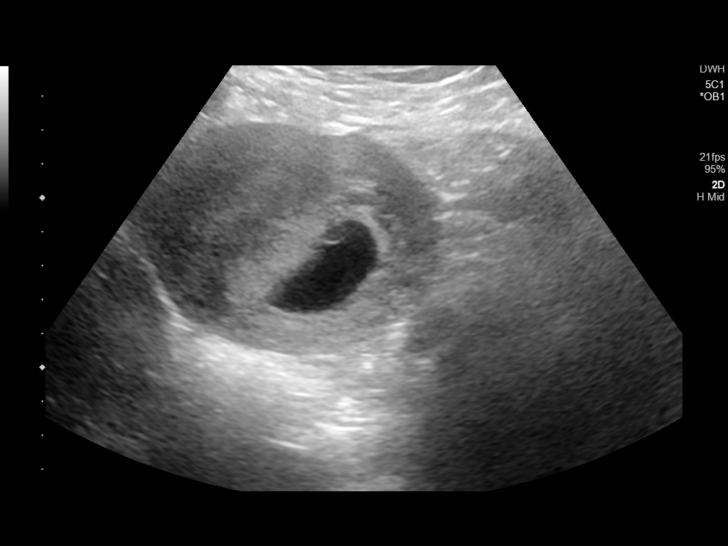
[im 11/58]
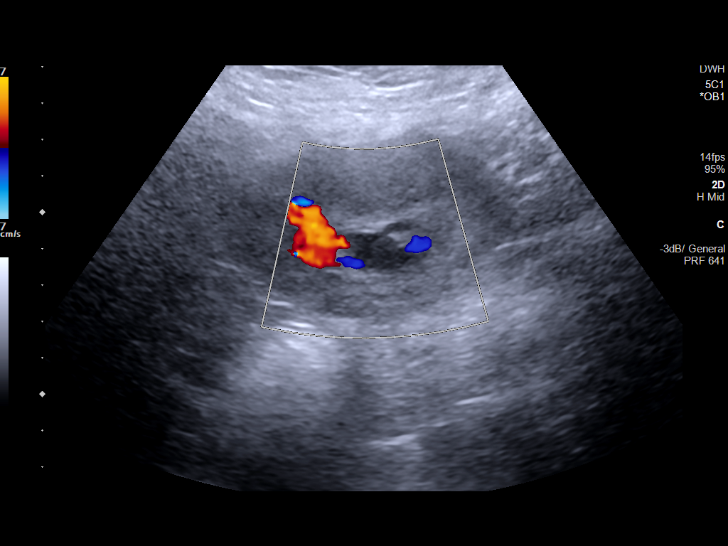
[im 15/58]
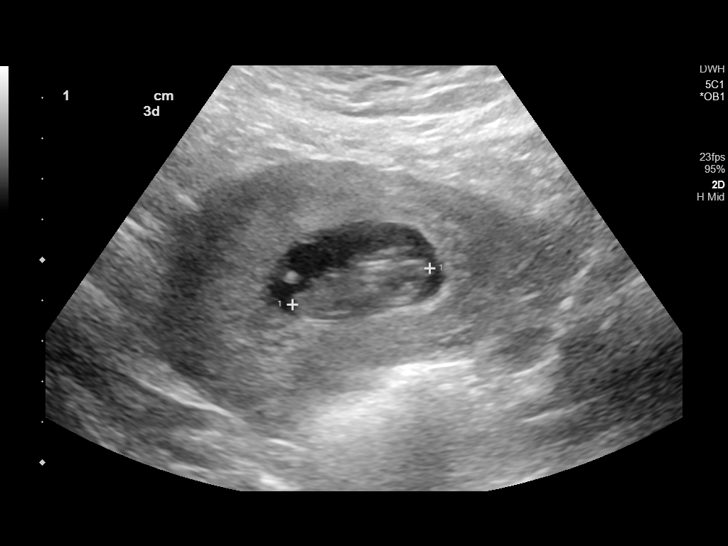
[im 20/58]
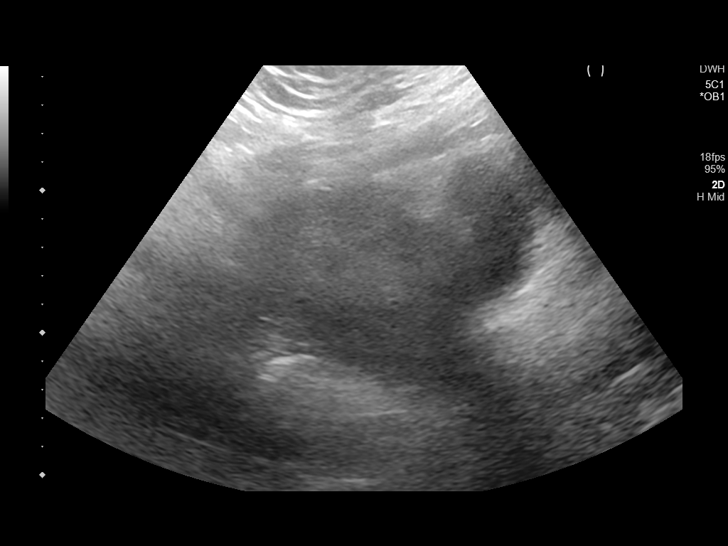
[im 24/58]
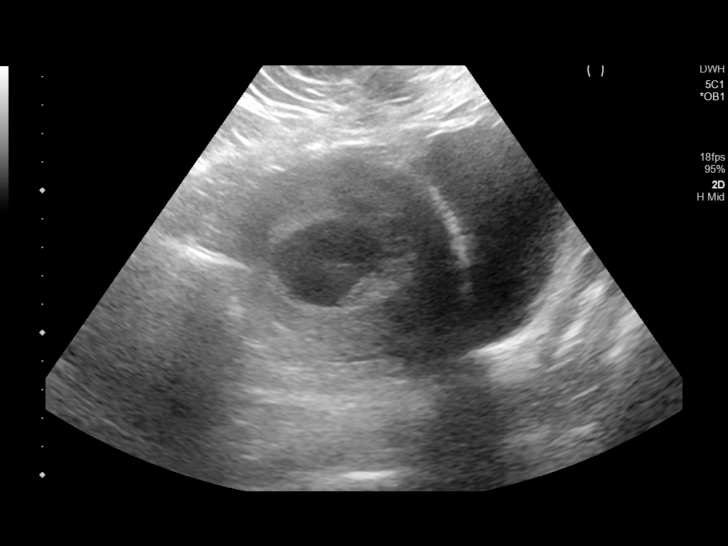
[im 28/58]
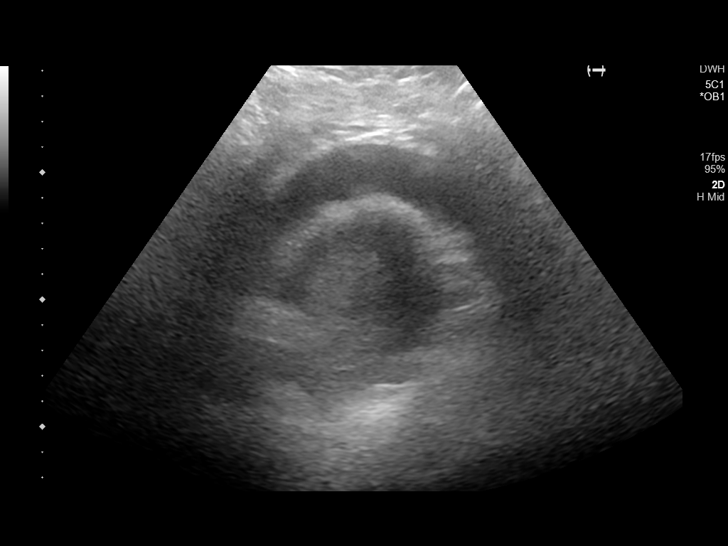
[im 32/58]
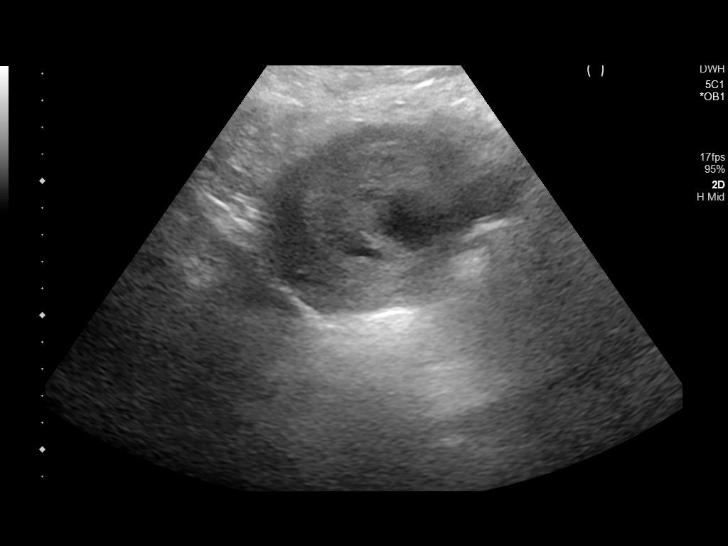
[im 36/58]
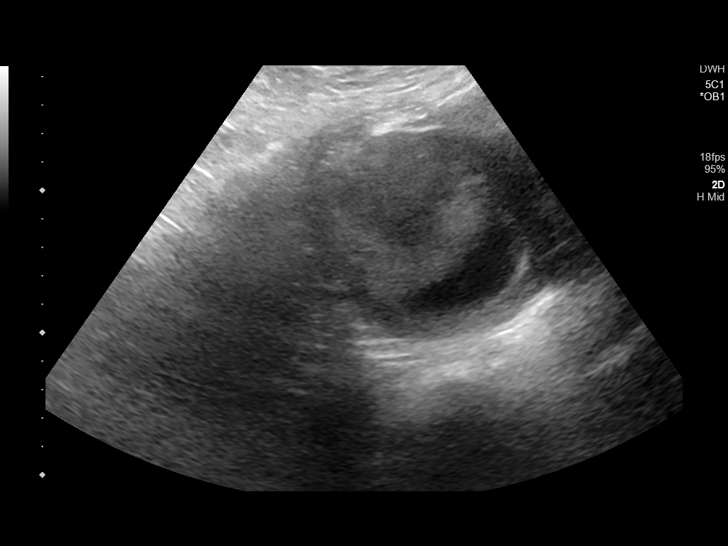
[im 41/58]
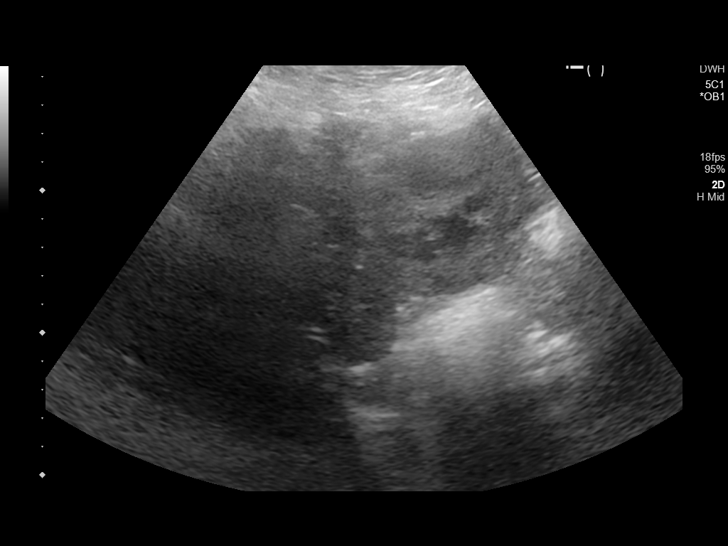
[im 45/58]
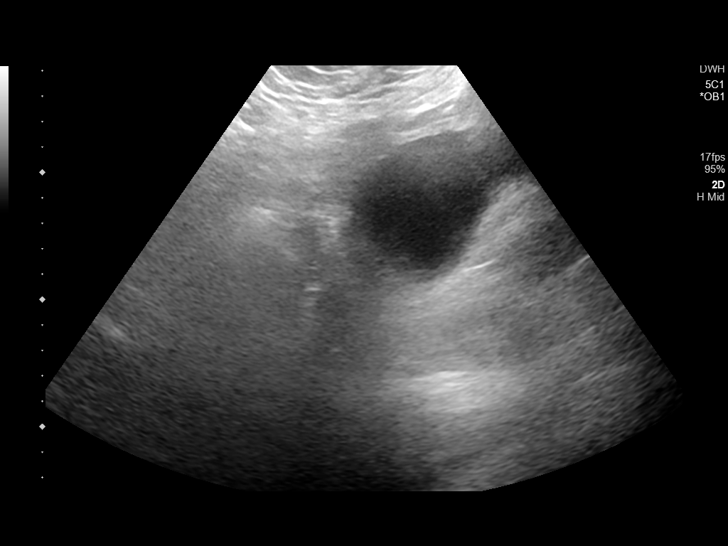
[im 49/58]
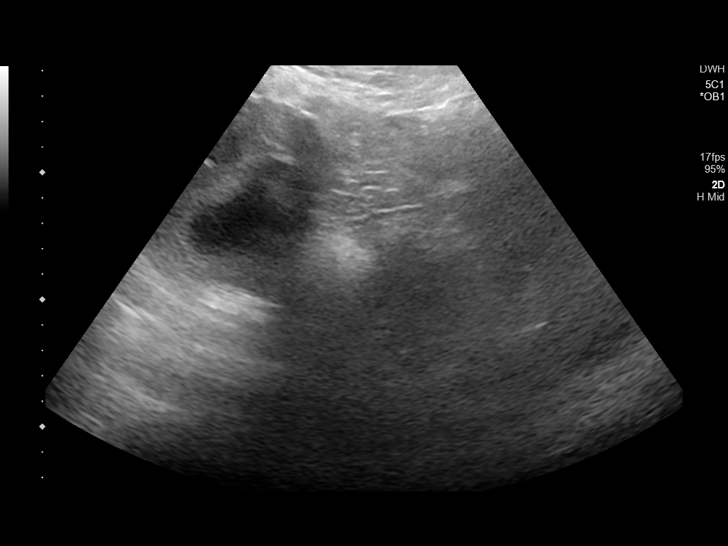
[im 53/58]
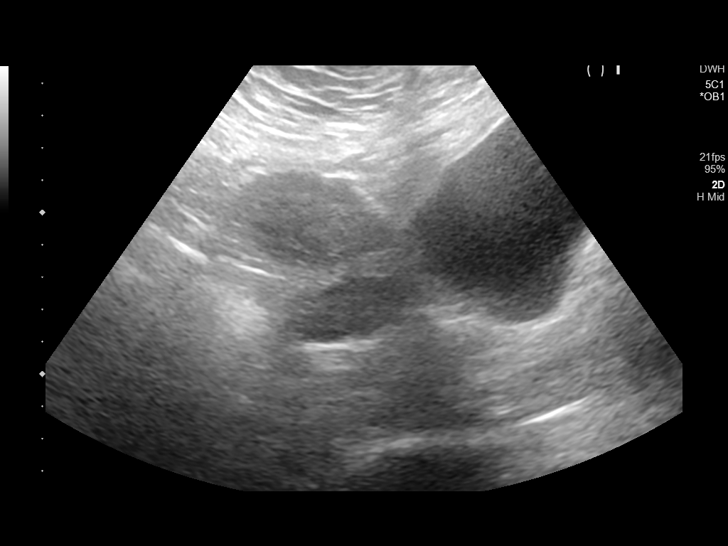
[im 58/58]
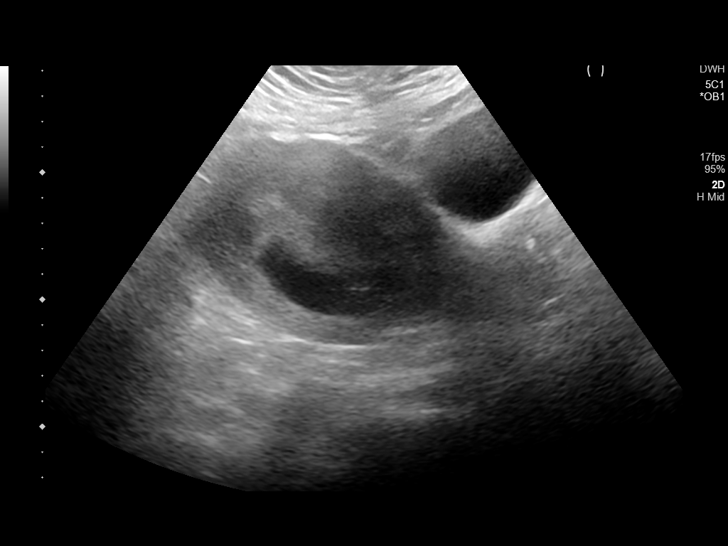

[14 of 28 positions shown; findings below may reference images not displayed]

FINDINGS: Intrauterine gestational sac: Single

Yolk sac:  Visualized

Embryo:  Visualized

Cardiac Activity: Visualized

Heart Rate: 168 bpm

MSD:    mm    w     d

CRL:   35.7 mm   10 w 3 d                  US EDC: 07/06/2019

Subchorionic hemorrhage:  Small subchorionic hemorrhage

Maternal uterus/adnexae: No adnexal mass or free fluid.
IMPRESSION: Ten week 3 day intrauterine pregnancy. Fetal heart rate 168 beats
per minute. Small subchorionic hemorrhage.
